# Patient Record
Sex: Female | Born: 1990 | Race: White | Hispanic: No | Marital: Single | Smoking: Current every day smoker
Health system: Southern US, Community
[De-identification: ages and names within clinical notes are randomized; demographics above are authoritative.]

## PROBLEM LIST (undated history)

## (undated) DIAGNOSIS — F1110 Opioid abuse, uncomplicated: Secondary | ICD-10-CM

## (undated) DIAGNOSIS — F1010 Alcohol abuse, uncomplicated: Secondary | ICD-10-CM

## (undated) DIAGNOSIS — B1920 Unspecified viral hepatitis C without hepatic coma: Secondary | ICD-10-CM

## (undated) DIAGNOSIS — K859 Acute pancreatitis without necrosis or infection, unspecified: Secondary | ICD-10-CM

## (undated) HISTORY — PX: TONSILLECTOMY: SUR1361

## (undated) HISTORY — PX: HX TONSILLECTOMY: SHX27

---

## 2016-07-23 ENCOUNTER — Encounter (HOSPITAL_BASED_OUTPATIENT_CLINIC_OR_DEPARTMENT_OTHER): Payer: Self-pay

## 2016-07-23 ENCOUNTER — Emergency Department (HOSPITAL_BASED_OUTPATIENT_CLINIC_OR_DEPARTMENT_OTHER): Payer: Self-pay

## 2016-07-23 ENCOUNTER — Inpatient Hospital Stay (HOSPITAL_BASED_OUTPATIENT_CLINIC_OR_DEPARTMENT_OTHER)
Admission: EM | Admit: 2016-07-23 | Discharge: 2016-07-27 | DRG: 439 | Discharge status: Against Medical Advice | Payer: Self-pay | Attending: Internal Medicine | Admitting: Internal Medicine

## 2016-07-23 DIAGNOSIS — E876 Hypokalemia: Secondary | ICD-10-CM | POA: Diagnosis present

## 2016-07-23 DIAGNOSIS — K860 Alcohol-induced chronic pancreatitis: Secondary | ICD-10-CM

## 2016-07-23 DIAGNOSIS — R112 Nausea with vomiting, unspecified: Secondary | ICD-10-CM

## 2016-07-23 DIAGNOSIS — F10239 Alcohol dependence with withdrawal, unspecified: Secondary | ICD-10-CM | POA: Diagnosis present

## 2016-07-23 DIAGNOSIS — K852 Alcohol induced acute pancreatitis without necrosis or infection: Secondary | ICD-10-CM

## 2016-07-23 DIAGNOSIS — K859 Acute pancreatitis without necrosis or infection, unspecified: Secondary | ICD-10-CM

## 2016-07-23 DIAGNOSIS — F17210 Nicotine dependence, cigarettes, uncomplicated: Secondary | ICD-10-CM | POA: Diagnosis present

## 2016-07-23 DIAGNOSIS — E860 Dehydration: Secondary | ICD-10-CM | POA: Diagnosis present

## 2016-07-23 DIAGNOSIS — B1920 Unspecified viral hepatitis C without hepatic coma: Secondary | ICD-10-CM

## 2016-07-23 DIAGNOSIS — N3000 Acute cystitis without hematuria: Secondary | ICD-10-CM

## 2016-07-23 DIAGNOSIS — K700 Alcoholic fatty liver: Secondary | ICD-10-CM

## 2016-07-23 DIAGNOSIS — Z720 Tobacco use: Secondary | ICD-10-CM

## 2016-07-23 DIAGNOSIS — D696 Thrombocytopenia, unspecified: Secondary | ICD-10-CM | POA: Insufficient documentation

## 2016-07-23 DIAGNOSIS — F1020 Alcohol dependence, uncomplicated: Secondary | ICD-10-CM

## 2016-07-23 DIAGNOSIS — N179 Acute kidney failure, unspecified: Secondary | ICD-10-CM | POA: Diagnosis present

## 2016-07-23 DIAGNOSIS — K7010 Alcoholic hepatitis without ascites: Secondary | ICD-10-CM | POA: Diagnosis present

## 2016-07-23 DIAGNOSIS — F1190 Opioid use, unspecified, uncomplicated: Secondary | ICD-10-CM | POA: Diagnosis present

## 2016-07-23 HISTORY — DX: Opioid abuse, uncomplicated (CMS HCC): F11.10

## 2016-07-23 HISTORY — DX: Alcohol abuse, uncomplicated: F10.10

## 2016-07-23 HISTORY — DX: Unspecified viral hepatitis C without hepatic coma: B19.20

## 2016-07-23 LAB — CBC WITH DIFF
BASOPHIL #: 0 x10ˆ3/uL (ref 0.00–0.10)
BASOPHIL %: 0 % (ref 0–3)
EOSINOPHIL #: 0 x10ˆ3/uL (ref 0.00–0.50)
EOSINOPHIL %: 0 % (ref 0–5)
HCT: 49.1 % — ABNORMAL HIGH (ref 36.0–45.0)
HGB: 16.6 g/dL — ABNORMAL HIGH (ref 12.0–15.5)
LYMPHOCYTE #: 0.5 10*3/uL — ABNORMAL LOW (ref 1.00–4.80)
LYMPHOCYTE #: 0.5 x10ˆ3/uL — ABNORMAL LOW (ref 1.00–4.80)
LYMPHOCYTE %: 6 % — ABNORMAL LOW (ref 15–43)
MCH: 31.7 pg (ref 27.5–33.2)
MCHC: 33.8 g/dL (ref 32.0–36.0)
MCV: 93.7 fL (ref 82.0–97.0)
MONOCYTE #: 0.4 x10ˆ3/uL (ref 0.20–0.90)
MONOCYTE %: 5 % (ref 5–12)
MPV: 9.9 fL (ref 7.4–10.5)
NEUTROPHIL #: 7.3 x10ˆ3/uL — ABNORMAL HIGH (ref 1.50–6.50)
NEUTROPHIL %: 89 % — ABNORMAL HIGH (ref 43–76)
PLATELETS: 103 x10ˆ3/uL — ABNORMAL LOW (ref 150–450)
RBC: 5.24 x10ˆ6/uL — ABNORMAL HIGH (ref 4.00–5.10)
RDW: 13.6 % (ref 11.0–16.0)
WBC: 8.2 x10ˆ3/uL (ref 4.0–11.0)

## 2016-07-23 LAB — COMPREHENSIVE METABOLIC PROFILE - BMC/JMC ONLY
ALBUMIN/GLOBULIN RATIO: 1.4 (ref 0.8–2.0)
ALBUMIN: 4.2 g/dL (ref 3.5–5.0)
ALKALINE PHOSPHATASE: 156 U/L — ABNORMAL HIGH (ref 38–126)
ALT (SGPT): 59 U/L — ABNORMAL HIGH (ref 14–54)
ANION GAP: 9 mmol/L (ref 3–11)
AST (SGOT): 201 U/L — ABNORMAL HIGH (ref 15–41)
BILIRUBIN TOTAL: 4 mg/dL — ABNORMAL HIGH (ref 0.3–1.2)
BUN/CREA RATIO: 11 (ref 6–22)
BUN/CREA RATIO: 11 (ref 6–22)
BUN: 11 mg/dL (ref 6–20)
CALCIUM: 9.6 mg/dL (ref 8.6–10.3)
CALCIUM: 9.6 mg/dL (ref 8.6–10.3)
CHLORIDE: 96 mmol/L — ABNORMAL LOW (ref 101–111)
CO2 TOTAL: 29 mmol/L (ref 22–32)
CREATININE: 1.01 mg/dL — ABNORMAL HIGH (ref 0.44–1.00)
CREATININE: 1.01 mg/dL — ABNORMAL HIGH (ref 0.44–1.00)
ESTIMATED GFR: >60 mL/min/1.73mˆ2 (ref >60)
GLUCOSE: 136 mg/dL — ABNORMAL HIGH (ref 70–110)
POTASSIUM: 3.8 mmol/L (ref 3.4–5.1)
PROTEIN TOTAL: 7.2 g/dL (ref 6.4–8.3)
SODIUM: 134 mmol/L — ABNORMAL LOW (ref 136–145)

## 2016-07-23 LAB — URINALYSIS WITH MICROSCOPIC REFLEX IF INDICATED BMC/JMC ONLY
GLUCOSE: NEGATIVE mg/dL
LEUKOCYTES: NEGATIVE WBCs/uL
NITRITE: POSITIVE — AB
PROTEIN: 100 mg/dL — AB
SPECIFIC GRAVITY: >=1.03 — ABNORMAL HIGH (ref <1.022)
UROBILINOGEN: 1 mg/dL (ref <=2.0)

## 2016-07-23 LAB — URINALYSIS, MICROSCOPIC

## 2016-07-23 LAB — BLUE TOP TUBE

## 2016-07-23 LAB — LIPID PANEL
CHOL/HDL RATIO: 3.1
CHOLESTEROL: 181 mg/dL (ref 120–199)
CHOLESTEROL: 181 mg/dL (ref 120–199)
HDL CHOL: 59 mg/dL (ref 45–65)
LDL CALC: 111 mg/dL (ref <=130)
TRIGLYCERIDES: 53 mg/dL (ref 35–135)
VLDL CALC: 11 mg/dL (ref 5–35)

## 2016-07-23 LAB — PT/INR
INR: 1.18
PROTHROMBIN TIME: 13 seconds — ABNORMAL HIGH (ref 9.4–12.5)

## 2016-07-23 LAB — PTT (PARTIAL THROMBOPLASTIN TIME): APTT: 26.3 seconds (ref 25.1–36.5)

## 2016-07-23 LAB — HCG, URINE QUALITATIVE, PREGNANCY: HCG URINE QUALITATIVE: NEGATIVE

## 2016-07-23 LAB — GREEN TUBE

## 2016-07-23 LAB — RED TOP TUBE

## 2016-07-23 MED ORDER — METOCLOPRAMIDE 5 MG/ML INJECTION SOLUTION
10.00 mg | INTRAMUSCULAR | Status: AC
Start: 2016-07-23 — End: 2016-07-23
  Administered 2016-07-23: 10 mg via INTRAVENOUS
  Filled 2016-07-23: qty 2

## 2016-07-23 MED ORDER — ACETAMINOPHEN 325 MG TABLET
650.0000 mg | ORAL_TABLET | Freq: Four times a day (QID) | ORAL | Status: DC | PRN
Start: 2016-07-23 — End: 2016-07-24

## 2016-07-23 MED ORDER — SODIUM CHLORIDE 0.9 % (FLUSH) INJECTION SYRINGE
10.0000 mL | INJECTION | Freq: Three times a day (TID) | INTRAMUSCULAR | Status: DC
Start: 2016-07-23 — End: 2016-07-23

## 2016-07-23 MED ORDER — LACTATED RINGERS INTRAVENOUS SOLUTION
INTRAVENOUS | Status: DC
Start: 2016-07-24 — End: 2016-07-27
  Administered 2016-07-24 – 2016-07-27 (×4): 0 via INTRAVENOUS

## 2016-07-23 MED ORDER — HYDROMORPHONE 2 MG/ML INJECTION SYRINGE
1.00 mg | INJECTION | INTRAMUSCULAR | Status: DC | PRN
Start: 2016-07-23 — End: 2016-07-25
  Administered 2016-07-24 – 2016-07-25 (×8): 1 mg via INTRAVENOUS
  Filled 2016-07-23 (×8): qty 1

## 2016-07-23 MED ORDER — THIAMINE HCL (VITAMIN B1) 100 MG/ML INJECTION SOLUTION
INTRAVENOUS | Status: AC
Start: 2016-07-24 — End: 2016-07-26
  Filled 2016-07-23 (×3): qty 1000

## 2016-07-23 MED ORDER — SODIUM CHLORIDE 0.9 % (FLUSH) INJECTION SYRINGE
10.0000 mL | INJECTION | Freq: Three times a day (TID) | INTRAMUSCULAR | Status: DC
Start: 2016-07-24 — End: 2016-07-27
  Administered 2016-07-24: 0 mL via INTRAVENOUS
  Administered 2016-07-24 – 2016-07-25 (×4): 10 mL via INTRAVENOUS
  Administered 2016-07-25: 0 mL via INTRAVENOUS
  Administered 2016-07-26: 10 mL via INTRAVENOUS
  Administered 2016-07-26: 0 mL via INTRAVENOUS
  Administered 2016-07-26: 10 mL via INTRAVENOUS
  Administered 2016-07-27 (×2): 0 mL via INTRAVENOUS

## 2016-07-23 MED ORDER — LORAZEPAM 2 MG/ML INJECTION SOLUTION
2.0000 mg | INTRAMUSCULAR | Status: DC | PRN
Start: 2016-07-23 — End: 2016-07-27
  Administered 2016-07-24 – 2016-07-27 (×6): 2 mg via INTRAVENOUS
  Filled 2016-07-23 (×6): qty 1

## 2016-07-23 MED ORDER — PIPERACILLIN-TAZOBACTAM 3.375 GRAM/50 ML DEXTROSE(ISO-OS) IV PIGGYBACK
3.3750 g | INJECTION | INTRAVENOUS | Status: AC
Start: 2016-07-23 — End: 2016-07-23
  Administered 2016-07-23: 0 g via INTRAVENOUS
  Administered 2016-07-23: 3.375 g via INTRAVENOUS
  Filled 2016-07-23: qty 50

## 2016-07-23 MED ORDER — SODIUM CHLORIDE 0.9 % (FLUSH) INJECTION SYRINGE
10.0000 mL | INJECTION | INTRAMUSCULAR | Status: DC | PRN
Start: 2016-07-23 — End: 2016-07-27
  Administered 2016-07-24: 10 mL via INTRAVENOUS

## 2016-07-23 MED ORDER — MORPHINE 4 MG/ML INJECTION WRAPPER
4.0000 mg | INJECTION | INTRAMUSCULAR | Status: DC | PRN
Start: 2016-07-23 — End: 2016-07-26

## 2016-07-23 MED ORDER — LORAZEPAM 2 MG/ML INJECTION SOLUTION
2.0000 mg | Freq: Four times a day (QID) | INTRAMUSCULAR | Status: DC | PRN
Start: 2016-07-23 — End: 2016-07-27
  Administered 2016-07-24 – 2016-07-27 (×9): 2 mg via INTRAVENOUS
  Filled 2016-07-23 (×9): qty 1

## 2016-07-23 MED ORDER — LORAZEPAM 2 MG/ML INJECTION SOLUTION
2.00 mg | INTRAMUSCULAR | Status: AC
Start: 2016-07-23 — End: 2016-07-23
  Administered 2016-07-23: 2 mg via INTRAVENOUS
  Filled 2016-07-23: qty 1

## 2016-07-23 MED ORDER — NICOTINE 14 MG/24 HR DAILY TRANSDERMAL PATCH
14.0000 mg | MEDICATED_PATCH | Freq: Every day | TRANSDERMAL | Status: DC
Start: 2016-07-24 — End: 2016-07-27
  Administered 2016-07-24 – 2016-07-27 (×7): 14 mg via TRANSDERMAL
  Filled 2016-07-23 (×4): qty 1

## 2016-07-23 MED ORDER — ONDANSETRON HCL (PF) 4 MG/2 ML INJECTION SOLUTION
4.0000 mg | Freq: Three times a day (TID) | INTRAMUSCULAR | Status: DC | PRN
Start: 2016-07-23 — End: 2016-07-27
  Administered 2016-07-24 – 2016-07-27 (×6): 4 mg via INTRAVENOUS
  Filled 2016-07-23 (×6): qty 2

## 2016-07-23 MED ORDER — NITROGLYCERIN 0.4 MG SUBLINGUAL TABLET
0.4000 mg | SUBLINGUAL_TABLET | SUBLINGUAL | Status: DC | PRN
Start: 2016-07-23 — End: 2016-07-27

## 2016-07-23 MED ORDER — SODIUM CHLORIDE 0.9 % IV BOLUS
1000.00 mL | INJECTION | Status: AC
Start: 2016-07-23 — End: 2016-07-23
  Administered 2016-07-23: 1000 mL via INTRAVENOUS
  Administered 2016-07-23: 0 mL via INTRAVENOUS

## 2016-07-23 NOTE — ED Provider Notes (Signed)
Caprice Red, MD  [facility]  Emergency Department Visit Note    Date:  07/23/2016  Primary care provider:  Pcp Not In System  Means of arrival:  private car  History obtained from: patient  History limited by: none    Chief Complaint:  Abdominal pain     HISTORY OF PRESENT ILLNESS     [name], date of birth 1991/03/19, is a 25 y.o. female who presents to the Emergency Department complaining of abdominal pain. Patient has been sick since June and has not gone to the doctor because she has does not have insurance. Patient states she has been vomiting multiple times everyday for the past few months. Patient cannot eat, and she can only drink liquids. Patient states the pain radiates into her mid back. She is having left upper abdominal pain/epigastric pain. She denies constant chest pain, and she states the only time it hurts is when she is vomiting. Patient states she always feels like she has something stuck in her throat. Patient keeps getting random small bruises recently, and she wakes up with them. Her fiance states she does not roll around in her sleep. The patients abdominal pain is always there but fluctuates in severity and was worse initially with eating but then she stopped eating 2 weeks ago, and now it is even worse when she drinks (has been drinking Ensure along with other liquids). Patient denies blood in her stool, blood in her vomit, dysuria, hematuria, fever, chills, weakness, and dizziness. Patient states she use to weigh 121 lbs 3 months ago, but is now 102 lbs. Patient has had irregular periods and is not sure when she last had any vaginal bleeding. Patient has not done any illicit drugs in two years (has been on Suboxone). Patient has been drinking 2-3 bottles of wine a day and this also seems to increase her pain. She affirms diarrhea once or twice a day. Patient was recently diagnosed a month ago with hepatitis C. Patient rates the constant pain a 9/10 at this time.          REVIEW OF SYSTEMS     The pertinent positive and negative symptoms are as per HPI. All other systems reviewed and are negative.     PATIENT HISTORY     Past Medical History:  Past Medical History:   Diagnosis Date    ETOH abuse     Hepatitis C     Heroin abuse        Past Surgical History:  Past Surgical History:   Procedure Laterality Date    Hx tonsillectomy         Family History:  Family Medical History     None        Social History:  Social History   Substance Use Topics    Smoking status: Current Every Day Smoker     Packs/day: 1.00     Types: Cigarettes    Smokeless tobacco: None    Alcohol use Yes      Comment: daily 2 large bottles of wine     History   Drug Use No     Comment: hx heroin       Medications:  Outpatient Prescriptions Marked as Taking for the 07/23/16 encounter [facility] Encounter)   Medication Sig    buprenorphine-naloxone (SUBOXONE) 8-2 mg Sublingual Tablet, Sublingual 2 Tabs by Sublingual route Twice daily       Allergies:  No Known Allergies    PHYSICAL  EXAM     Vitals:   07/23/16 1612   BP: (!) 161/94   Pulse: (!) 102   Resp: (!) 24   Temp: 36.7 C (98 F)   SpO2: 97%       Pulse ox  97% on None (Room Air) interpreted by me as: Normal    Constitutional: thin young female, no acute distress  Head: Normocephalic and atraumatic.   ENT: Moist mucous membranes. No erythema or exudates in the oropharynx.  Eyes: EOM are normal. Pupils are equal, round, and reactive to light. No scleral icterus.   Neck: Neck supple. No meningismus.  Cardiovascular: Normal rate and regular rhythm. Normal S1 and S2. Exam reveals no gallop and no friction rub.  No murmur heard.  Pulmonary/Chest: Effort normal and breath sounds normal.   Abdominal: Soft. No distension. No organomegaly. Normal bowel sounds present. Epigastric and left upper quadrant tenderness with some voluntary guarding.   Back: There is no CVA tenderness.  Musculoskeletal: Normal range of motion. No edema and no extremity tenderness. No  clubbing or cyanosis.  Lymphadenopathy: No cervical adenopathy.   Neurological: Patient is alert and oriented to person, place, and time. Strength and sensation normal in all extremities. Cranial nerves II - XII intact. Normal speech.  Skin: Skin is warm and dry. No rash noted.     DIAGNOSTIC STUDIES     Labs:    Results for orders placed or performed during the hospital encounter of 07/23/16   COMPREHENSIVE METABOLIC PROFILE - [facility] ONLY   Result Value Ref Range    SODIUM 134 (L) 136 - 145 mmol/L    POTASSIUM 3.8 3.4 - 5.1 mmol/L    CHLORIDE 96 (L) 101 - 111 mmol/L    CO2 TOTAL 29 22 - 32 mmol/L    ANION GAP 9 3 - 11 mmol/L    BUN 11 6 - 20 mg/dL    CREATININE 1.61 (H) 0.44 - 1.00 mg/dL    BUN/CREA RATIO 11 6 - 22    ESTIMATED GFR >60 >60 mL/min/1.51m2    ALBUMIN 4.2 3.5 - 5.0 g/dL    CALCIUM 9.6 8.6 - 09.6 mg/dL    GLUCOSE 045 (H) 70 - 110 mg/dL    ALKALINE PHOSPHATASE 156 (H) 38 - 126 U/L    ALT (SGPT) 59 (H) 14 - 54 U/L    AST (SGOT) 201 (H) 15 - 41 U/L    BILIRUBIN TOTAL 4.0 (H) 0.3 - 1.2 mg/dL    PROTEIN TOTAL 7.2 6.4 - 8.3 g/dL    ALBUMIN/GLOBULIN RATIO 1.4 0.8 - 2.0   LIPASE   Result Value Ref Range    LIPASE 917 (H) 22 - 51 U/L   HCG, URINE QUALITATIVE, PREGNANCY   Result Value Ref Range    HCG URINE QUALITATIVE Negative    URINALYSIS WITH MICROSCOPIC REFLEX IF INDICATED [facility] ONLY   Result Value Ref Range    COLOR Orange (A) Light Yellow, Straw, Yellow    APPEARANCE Clear Clear    PH 6.0 <8.0    LEUKOCYTES Negative Negative WBCs/uL    NITRITE Positive (A) Negative    PROTEIN 100  (A) Negative mg/dL    GLUCOSE Negative Negative mg/dL    KETONES Trace (A) Negative mg/dL    UROBILINOGEN 1.0 <=4.0 mg/dL    BILIRUBIN Moderate (A) Negative mg/dL    BLOOD Negative Negative mg/dL    SPECIFIC GRAVITY >=9.811 (H) <1.022   CBC WITH DIFF   Result Value Ref Range  WBC 8.2 4.0 - 11.0 x103/uL    RBC 5.24 (H) 4.00 - 5.10 x106/uL    HGB 16.6 (H) 12.0 - 15.5 g/dL    HCT 16.149.1 (H) 09.636.0 - 45.0 %    MCV 93.7 82.0 -  97.0 fL    MCH 31.7 27.5 - 33.2 pg    MCHC 33.8 32.0 - 36.0 g/dL    RDW 04.513.6 40.911.0 - 81.116.0 %    PLATELETS 103 (L) 150 - 450 x103/uL    MPV 9.9 7.4 - 10.5 fL    NEUTROPHIL % 89 (H) 43 - 76 %    LYMPHOCYTE % 6 (L) 15 - 43 %    MONOCYTE % 5 5 - 12 %    EOSINOPHIL % 0 0 - 5 %    BASOPHIL % 0 0 - 3 %    NEUTROPHIL # 7.30 (H) 1.50 - 6.50 x103/uL    LYMPHOCYTE # 0.50 (L) 1.00 - 4.80 x103/uL    MONOCYTE # 0.40 0.20 - 0.90 x103/uL    EOSINOPHIL # 0.00 0.00 - 0.50 x103/uL    BASOPHIL # 0.00 0.00 - 0.10 x103/uL   BLUE TOP TUBE   Result Value Ref Range    RAINBOW/EXTRA TUBE AUTO RESULT Yes    RED TOP TUBE   Result Value Ref Range    RAINBOW/EXTRA TUBE AUTO RESULT Yes    Green Tube   Result Value Ref Range    RAINBOW/EXTRA TUBE AUTO RESULT Yes    URINALYSIS, MICROSCOPIC   Result Value Ref Range    RBCS 0-2 0 - 2 /hpf    WBCS 0-2 0 - 2 /hpf    BACTERIA Marked (A) None /hpf    SQUAMOUS EPITHELIAL 5-10 (A) 0 - 2 /hpf    TRANSITIONAL EPITHELIAL 0-2 0 - 2 /hpf    MUCOUS Marked (A) None /hpf    HYALINE CASTS 0-2 0 - 2 /lpf   PT/INR   Result Value Ref Range    PROTHROMBIN TIME 13.0 (H) 9.4 - 12.5 seconds    INR 1.18    PTT (PARTIAL THROMBOPLASTIN TIME)   Result Value Ref Range    APTT 26.3 25.1 - 36.5 seconds     Labs reviewed and interpreted by me.    Radiology:    US ABDOMEN RIGHT UPPER QUADRANT 1. Diffuse fatty liver 2. Borderline common bile duct at 7 mm. MRCP is recommended rule out distal common bile duct stone 3. No gallstones.    MRCP: Moderate to severe pancreatitis.  No CBD stone.  No gallstones.      Radiological imaging interpreted by radiologist and independently reviewed by me.    ED PROGRESS NOTE / MEDICAL DECISION MAKING     Old records reviewed by me:  I have reviewed the nurse's notes. I have reviewed the patient's problem list.      Orders Placed This Encounter    URINE CULTURE    US ABDOMEN RIGHT UPPER QUADRANT    MRI MRCP WO IV CONTRAST    CBC/DIFF    COMPREHENSIVE METABOLIC PROFILE - [facility] ONLY     LIPASE    HCG, URINE QUALITATIVE, PREGNANCY    URINALYSIS WITH MICROSCOPIC REFLEX IF INDICATED [facility] ONLY    URINALYSIS, MICROSCOPIC    PT/INR    PTT (PARTIAL THROMBOPLASTIN TIME)    ADDITIONAL TESTING  REQUEST    INSERT & MAINTAIN PERIPHERAL IV ACCESS    NS flush syringe    NS bolus infusion 1,000 mL  piperacillin-tazobactam (ZOSYN) 3.375 g in iso-osmotic 50 mL premix IVPB    metoclopramide (REGLAN) 5 mg/mL injection    LORazepam (ATIVAN) 2 mg/mL injection       US abdomen RUQ, CBC, Lipase, HCG, PT/INR, and PTT ordered.    6:48 PM: Initial evaluation is complete at this time. I discussed with the patient that I would order labs and additional imaging (MRI per radiologist's recommendations) to further evaluate. Patient is agreeable with the treatment plan at this time. I have ordered IV antibiotics to treat the UTI, reglan to treat her nausea, and ativan to help with mild alcohol withdrawal.      9:59 PM: I discussed the patient's case and above findings at length with Dr. Jonette Pesa [facility]) who agrees to admit.    10:06 PM: On recheck,  I explained the results of the diagnostic studies- moderate to severe pancreatitis, but no evidence of a CBD stone. I informed the patient of my discussion with Dr. Jonette Pesa (Hospitalist). I discussed the diagnosis and the plan for admission. The patient understood and is in accordance with the treatment plan at this time. All of her questions have been answered to her satisfaction. The patient is in fair condition at the time of admission. I strongly advised the patient to stop drinking alcohol (not drink ever again).     I have screened the patient for tobacco use and the patient is a tobacco user.  I have counseled the patient for less than 3 minutes to quit using tobacco products due to their multiple adverse health effects.     Pre-hypertension/Hypertension: The patient has been informed that they may have pre-hypertension or hypertension based on an elevated  blood pressure reading in the ED.  The admitting physician will follow up on this while the patient is in the hospital    Pre-Disposition Vitals:  Filed Vitals:    07/23/16 1730 07/23/16 1800 07/23/16 2026 07/23/16 2213   BP: (!) 127/99 (!) 128/98 118/88 122/87   Pulse: 78  74 76   Resp: 17  16 16    Temp:       SpO2: 99% 99% 99% 99%     CLINICAL IMPRESSION     Encounter Diagnoses   Name Primary?    Acute pancreatitis Yes    Intractable vomiting with nausea, unspecified vomiting type     Acute cystitis without hematuria      DISPOSITION/PLAN     Admitted        Condition at Disposition: Fair        SCRIBE ATTESTATION STATEMENT  I Justyce Knotts, SCRIBE scribed for Caprice Red, MD on 07/23/2016 at 6:48 PM.     Documentation assistance provided for Lynsi Dooner, Cristal Deer, MD  by Idalia Needle, SCRIBE. Information recorded by the scribe was done at my direction and has been reviewed and validated by me Taryn Shellhammer, Cristal Deer, MD.

## 2016-07-23 NOTE — ED Nurses Note (Signed)
Upon entering the room I introduced myself and explained my role to the pt. I verified with the pts name and date of birth. I educated the pt on testing and updated them on their status and next step to be taken. Pt is resting comfortably in stretcher in no signs of distress with call bell in reach. Upon leaving the room I asked if there was anything else I could do for them.

## 2016-07-23 NOTE — ED Nurses Note (Signed)
Called and spoke to TabernashRyan in MRI and he stated that it would be at least another 20 minutes before he could take the patient.

## 2016-07-23 NOTE — ED Nurses Note (Signed)
Report called to the 5th floor.

## 2016-07-23 NOTE — ED Nurses Note (Signed)
Pt returned from radiology testing and placed back on monitor. Side rails up and call bell in reach. Pt in no signs of distress. Educated pt/family on how long it would take for results of testing.

## 2016-07-23 NOTE — ED Nurses Note (Signed)
Blood drawn with IV placement. Tubes labeled at the bedside, pt verified labels. Blood tubed to lab.

## 2016-07-23 NOTE — H&P (Addendum)
[facility]  [place], [place] [address]    General History and Physical  Candelaria Celeste DNP, APRN     Shane Crutch  Date of Admission:  07/23/2016  Date of Birth:  Jun 19, 1991    PCP: Pcp Not In System  Chief Complaint:  Abdominal pain and vomiting      HPI: [name] is a 25 y.o., White female who presents with abdominal pain and intractable nausea and vomiting.  The patient reports that she has had vomiting nearly every day since June but has not went to a doctor for workup due to not having insurance.  She reports that now she is not able to eat at all and can only keep down some liquids.  The pain is predominantly in the epigastric and left upper quadrant pain however she does report it does radiate around to the left flank and left back.  She reports that the nausea and vomiting as well as the abdominal pain has significantly increased over the past 2 days.  She denies any chest pain but does have epigastric pain with vomiting.  She affirms that she has been bruising easily.  She denies any hematemesis, melena, or hematochezia, fevers, chills, urinary symptoms, or dizziness.  The patient reports that she has had approximately 18-20 lb weight loss over the past several months.  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  She does takes Subutex but has been unable to keep it down in the past 1-2 days.  The patient also reports that she has been a heavy drinker drinking 2-3 large bottles of wine per day.  She reports that she has been trying to cut back over the past couple weeks, but now is unable to stop vomiting or take the abdominal pain. She does have loose stools 1-2 times per day and she affirms that she has was diagnosed with hepatitis C approximately 1 month ago.  The patient is being admitted for acute pancreatitis with intractable nausea and vomiting.  We have discussed a plan for pain management and she has agreed to a  short period of time of IV opiates as she is unable to keep down her Subutex.  She agrees that when she is able to adequately keep down p.o. fluids she will go back to the Subutex.  She is also going to be medicated for her alcohol withdrawal.  The patient has an ultimate goal of alcohol cessation.    Active Hospital Problems   (*Primary Problem)    Diagnosis    Acute pancreatitis       Past Medical History:   Diagnosis Date    ETOH abuse     Hepatitis C     Heroin abuse        Past Surgical History:   Procedure Laterality Date    HX TONSILLECTOMY         Medications Prior to Admission     Prescriptions    buprenorphine-naloxone (SUBOXONE) 8-2 mg Sublingual Tablet, Sublingual    2 Tabs by Sublingual route Twice daily          Current Facility-Administered Medications:  NS flush syringe 10 mL Intravenous Q8H       No Known Allergies    Social History   Substance Use Topics    Smoking status: Current Every Day Smoker     Packs/day: 1.00     Types: Cigarettes  Smokeless tobacco: Not on file    Alcohol use Yes      Comment: daily 2 large bottles of wine       Family Medical History     None          ROS: Other than ROS in the HPI, all other systems were negative.    DNR Status:  No Order    EXAM:  Temperature: 36.7 C (98 F)  Heart Rate: 76  BP (Non-Invasive): 122/87  Respiratory Rate: 16  SpO2-1: 99 %  Pain Score (Numeric, Faces): 9      Constitutional: appears chronically ill, acutely ill, mild distress and vital signs reviewed and discussed with patient  Eyes: Conjunctiva clear., Pupils equal and round. , Sclera non-icteric.   ENT: Nose without erythema. , Mouth mucous membranes dry. , Pharynx without injection or exudate. , No oral lesions.   Neck: no thyromegaly or lymphadenopathy and supple, symmetrical, trachea midline  Respiratory: Clear to auscultation bilaterally. , Respirations are equal nonlabored  Cardiovascular: regular rate and rhythm, S1, S2 normal, no murmur, click, rub or gallop  No JVD      No pedal edema.  DP pulses are palpable and equal bilaterally.  Gastrointestinal: non-distended, Bowel sounds: Hypoactive, Palpation: Soft and Tenderness: LUQ, epigastric, LLQ, No masses, No hernias, No CVA Tenderness  Genitourinary: Deferred  Musculoskeletal: Head atraumatic and normocephalic, AROM and PROM  Integumentary:  Skin warm and dry, No rashes, No lesions and No track marks apparent  Neurologic: CN II - XII grossly intact , Alert and oriented X 3, normal strength and tone. Normal symmetric reflexes. Normal coordination and gait, No tremor  Lymphatic/Immunologic/Hematologic: No lymphadenopathy, Cervical, supraclavicular, and axillary nodes normal.  Psychiatric: Normal, No suicidal or homicidal ideations, Pleasant and sincere    Labs:    I have reviewed all lab results.  Lab Results for Last 24 Hours:    Results for orders placed or performed during the hospital encounter of 07/23/16 (from the past 24 hour(s))   HCG, URINE QUALITATIVE, PREGNANCY   Result Value Ref Range    HCG URINE QUALITATIVE Negative    URINALYSIS WITH MICROSCOPIC REFLEX IF INDICATED [facility] ONLY   Result Value Ref Range    COLOR Orange (A) Light Yellow, Straw, Yellow    APPEARANCE Clear Clear    PH 6.0 <8.0    LEUKOCYTES Negative Negative WBCs/uL    NITRITE Positive (A) Negative    PROTEIN 100  (A) Negative mg/dL    GLUCOSE Negative Negative mg/dL    KETONES Trace (A) Negative mg/dL    UROBILINOGEN 1.0 <=1.6<=2.0 mg/dL    BILIRUBIN Moderate (A) Negative mg/dL    BLOOD Negative Negative mg/dL    SPECIFIC GRAVITY >=1.096>=1.030 (H) <1.022   URINALYSIS, MICROSCOPIC   Result Value Ref Range    RBCS 0-2 0 - 2 /hpf    WBCS 0-2 0 - 2 /hpf    BACTERIA Marked (A) None /hpf    SQUAMOUS EPITHELIAL 5-10 (A) 0 - 2 /hpf    TRANSITIONAL EPITHELIAL 0-2 0 - 2 /hpf    MUCOUS Marked (A) None /hpf    HYALINE CASTS 0-2 0 - 2 /lpf   COMPREHENSIVE METABOLIC PROFILE - [facility] ONLY   Result Value Ref Range    SODIUM 134 (L) 136 - 145 mmol/L    POTASSIUM 3.8 3.4 - 5.1 mmol/L     CHLORIDE 96 (L) 101 - 111 mmol/L    CO2 TOTAL 29 22 - 32 mmol/L  ANION GAP 9 3 - 11 mmol/L    BUN 11 6 - 20 mg/dL    CREATININE 1.61 (H) 0.44 - 1.00 mg/dL    BUN/CREA RATIO 11 6 - 22    ESTIMATED GFR >60 >60 mL/min/1.52m2    ALBUMIN 4.2 3.5 - 5.0 g/dL    CALCIUM 9.6 8.6 - 09.6 mg/dL    GLUCOSE 045 (H) 70 - 110 mg/dL    ALKALINE PHOSPHATASE 156 (H) 38 - 126 U/L    ALT (SGPT) 59 (H) 14 - 54 U/L    AST (SGOT) 201 (H) 15 - 41 U/L    BILIRUBIN TOTAL 4.0 (H) 0.3 - 1.2 mg/dL    PROTEIN TOTAL 7.2 6.4 - 8.3 g/dL    ALBUMIN/GLOBULIN RATIO 1.4 0.8 - 2.0   LIPASE   Result Value Ref Range    LIPASE 917 (H) 22 - 51 U/L   CBC WITH DIFF   Result Value Ref Range    WBC 8.2 4.0 - 11.0 x103/uL    RBC 5.24 (H) 4.00 - 5.10 x106/uL    HGB 16.6 (H) 12.0 - 15.5 g/dL    HCT 40.9 (H) 81.1 - 45.0 %    MCV 93.7 82.0 - 97.0 fL    MCH 31.7 27.5 - 33.2 pg    MCHC 33.8 32.0 - 36.0 g/dL    RDW 91.4 78.2 - 95.6 %    PLATELETS 103 (L) 150 - 450 x103/uL    MPV 9.9 7.4 - 10.5 fL    NEUTROPHIL % 89 (H) 43 - 76 %    LYMPHOCYTE % 6 (L) 15 - 43 %    MONOCYTE % 5 5 - 12 %    EOSINOPHIL % 0 0 - 5 %    BASOPHIL % 0 0 - 3 %    NEUTROPHIL # 7.30 (H) 1.50 - 6.50 x103/uL    LYMPHOCYTE # 0.50 (L) 1.00 - 4.80 x103/uL    MONOCYTE # 0.40 0.20 - 0.90 x103/uL    EOSINOPHIL # 0.00 0.00 - 0.50 x103/uL    BASOPHIL # 0.00 0.00 - 0.10 x103/uL   BLUE TOP TUBE   Result Value Ref Range    RAINBOW/EXTRA TUBE AUTO RESULT Yes    RED TOP TUBE   Result Value Ref Range    RAINBOW/EXTRA TUBE AUTO RESULT Yes    Green Tube   Result Value Ref Range    RAINBOW/EXTRA TUBE AUTO RESULT Yes    PT/INR   Result Value Ref Range    PROTHROMBIN TIME 13.0 (H) 9.4 - 12.5 seconds    INR 1.18    PTT (PARTIAL THROMBOPLASTIN TIME)   Result Value Ref Range    APTT 26.3 25.1 - 36.5 seconds       Imaging Studies:  See Below    EXAMINATION: MRI MRCP WO IV CONTRAST     EXAM DATE/TIME:  07/23/2016 9:22 PM    CLINICAL INDICATION: pancreatitis, dilated CBD on Ultrasound    COMPARISON:  None.    FINDINGS:    Gall Bladder: Normal appearance of gall bladder without distension or  stones.    Common Bile Duct: 7 mm. No common bile duct stones.    Pancreatic duct: Normal. Not dilated.    Liver: Normal size. No focal masses.    Spleen: Normal size and appearance.    Pancreas: Diffuse pancreatic edema and peripancreatic edema in the  retroperitoneum    Other soft tissues: No abnormality seen.      IMPRESSION:  1. Changes consistent with moderate to severe acute pancreatitis.  2. No evidence of gallstones or common bile duct stones.            EXAMINATION: US ABDOMEN RIGHT UPPER QUADRANT     EXAM DATE/TIME:  07/23/2016 6:46 PM    CLINICAL INDICATION: epigastric pain, elevated lipase and LFTs including  bilirubin of 4    COMPARISON: None.    FINDINGS:      - LIVER: Normal size. Moderately increased echogenicity consistent with  diffuse fatty liver. No focal lesions. There is no intrahepatic bile duct  dilatation.     - GALL BLADDER: Normally distended without evidence of stone or sludge.      - COMMON BILE DUCT: Borderline size at 7 mm. There is no intrahepatic bile  duct dilatation.     - PANCREAS: Head and neck are partially seen and unremarkable. Body and  tail are not seen due to stomach gas.     - RIGHT KIDNEY: Normal size 10-11 cm and appearance.      - ASCITES: None    IMPRESSION:  1. Diffuse fatty liver  2. Borderline common bile duct at 7 mm. MRCP is recommended rule out distal  common bile duct stone  3. No gallstones.      DVT RISK FACTORS HAVE BEN ASSESSED AND PROPHYLAXIS ORDERED (SEE RUBYONLINE - REFERENCE TOOLS - MD, DVT PROPHY OR POCKET CARD)    Assessment/Plan:     --Acute Pancreatitis:  High rate IV fluids, pain management per our discussion.  Continue to monitor lipase and LFTs.  NPO for now.  No evidence of gallstones.  Borderline common bile duct at 7 mm per MRCP. No stones.  Will check lipid panel.  This may be secondary to alcohol abuse.  Will also give IV  Protonix for epigastric pain    --AKI:  Secondary to dehydration.  Giving high rate IV fluids for the pancreatitis.  Plan will recheck renal function in the a.m.Marland Kitchen    --Dehydration:  Secondary to intractable nausea and vomiting from acute pancreatitis.  Will give IV fluids and monitor patient closely.  Supportive care as needed.  Will give IV antiemetics    --Alcohol abuse:  Patient wishes for cessation.  Supportive care is necessary at this time.  Will give IV Ativan for alcohol withdrawal symptoms per the CIWA protocol.  Banana bag daily x3.  Recommend obtaining resources for outpatient assistance with cessation prior to the patient's discharge.    --Subutex use:  History of Heroin use in remission for 2 years.  Patient takes Subutex.  We admitted agreement that the patient is willing to take IV opiates until the acute pancreatitis has improved and patient is able to return to oral medications at which time she will resume her Subutex.      DVT prophylaxis:  Contraindicated at this time  GI prophylaxis: Protonix.  Code status:  Full code.        Portions of this note may be dictated using voice recognition software or a dictation service. Variances in spelling and vocabulary are possible and unintentional. Not all errors are caught/corrected. Please notify the Thereasa Parkin if any discrepancies are noted or if the meaning of any statement is not clear.       ------------------------------------------------------------------------------------------------------------  I discussed the case with Candelaria Celeste, NP and agree with the plan documented above.     Dorthy Cooler, MD

## 2016-07-23 NOTE — ED Triage Notes (Signed)
C/o abdominal pain and vomiting for months, reports hx hepatitis.

## 2016-07-23 NOTE — ED Nurses Note (Signed)
MRI screening form completed and faxed.

## 2016-07-24 ENCOUNTER — Inpatient Hospital Stay (HOSPITAL_BASED_OUTPATIENT_CLINIC_OR_DEPARTMENT_OTHER): Payer: Self-pay

## 2016-07-24 DIAGNOSIS — D696 Thrombocytopenia, unspecified: Secondary | ICD-10-CM | POA: Insufficient documentation

## 2016-07-24 LAB — COMPREHENSIVE METABOLIC PROFILE - BMC/JMC ONLY
ALBUMIN/GLOBULIN RATIO: 1.3 (ref 0.8–2.0)
ALBUMIN: 3 g/dL — ABNORMAL LOW (ref 3.5–5.0)
ALKALINE PHOSPHATASE: 106 U/L (ref 38–126)
ALT (SGPT): 35 U/L (ref 14–54)
ANION GAP: 8 mmol/L (ref 3–11)
AST (SGOT): 110 U/L — ABNORMAL HIGH (ref 15–41)
BILIRUBIN TOTAL: 3.7 mg/dL — ABNORMAL HIGH (ref 0.3–1.2)
BUN/CREA RATIO: 16 (ref 6–22)
BUN: 12 mg/dL (ref 6–20)
CALCIUM: 8.6 mg/dL (ref 8.6–10.3)
CHLORIDE: 103 mmol/L (ref 101–111)
CO2 TOTAL: 27 mmol/L (ref 22–32)
CREATININE: 0.75 mg/dL (ref 0.44–1.00)
ESTIMATED GFR: >60 mL/min/1.73mˆ2 (ref >60)
GLUCOSE: 69 mg/dL — ABNORMAL LOW (ref 70–110)
POTASSIUM: 4.3 mmol/L (ref 3.4–5.1)
PROTEIN TOTAL: 5.4 g/dL — ABNORMAL LOW (ref 6.4–8.3)
PROTEIN TOTAL: 5.4 g/dL — ABNORMAL LOW (ref 6.4–8.3)
SODIUM: 138 mmol/L (ref 136–145)

## 2016-07-24 LAB — CBC WITH DIFF
BASOPHIL #: 0 x10ˆ3/uL (ref 0.00–0.10)
BASOPHIL %: 0 % (ref 0–3)
EOSINOPHIL #: 0.1 x10ˆ3/uL (ref 0.00–0.50)
EOSINOPHIL %: 3 % (ref 0–5)
HCT: 39.4 % (ref 36.0–45.0)
HGB: 13.4 g/dL (ref 12.0–15.5)
HGB: 13.4 g/dL (ref 12.0–15.5)
LYMPHOCYTE #: 1 x10ˆ3/uL (ref 1.00–4.80)
LYMPHOCYTE %: 20 % (ref 15–43)
MCH: 32.4 pg (ref 27.5–33.2)
MCHC: 34 g/dL (ref 32.0–36.0)
MCV: 95.3 fL (ref 82.0–97.0)
MONOCYTE #: 0.3 x10ˆ3/uL (ref 0.20–0.90)
MONOCYTE %: 6 % (ref 5–12)
MPV: 10.3 fL (ref 7.4–10.5)
NEUTROPHIL #: 3.6 x10ˆ3/uL (ref 1.50–6.50)
NEUTROPHIL %: 71 % (ref 43–76)
PLATELETS: 72 x10ˆ3/uL — ABNORMAL LOW (ref 150–450)
RBC: 4.13 x10ˆ6/uL (ref 4.00–5.10)
RDW: 14 % (ref 11.0–16.0)
WBC: 5.1 x10?3/uL (ref 4.0–11.0)
WBC: 5.1 x10ˆ3/uL (ref 4.0–11.0)

## 2016-07-24 LAB — POC FINGERSTICK GLUCOSE - BMC/JMC (RESULTS): GLUCOSE, POC: 85 mg/dL (ref 60–100)

## 2016-07-24 LAB — MAGNESIUM: MAGNESIUM: 2.1 mg/dL (ref 1.4–2.1)

## 2016-07-24 LAB — FERRITIN: FERRITIN: 227 ng/mL (ref 11–307)

## 2016-07-24 LAB — IRON TRANSFERRIN AND TIBC
IRON (TRANSFERRIN) SATURATION: 35 % (ref 15–50)
IRON: 73 ug/dL (ref 28–170)
TRANSFERRIN: 147 mg/dL — ABNORMAL LOW (ref 192–282)

## 2016-07-24 LAB — AMYLASE: AMYLASE: 435 U/L — ABNORMAL HIGH (ref 5–100)

## 2016-07-24 MED ORDER — GADOTERIDOL 279.3 MG/ML INTRAVENOUS SOLUTION
10.00 mL | INTRAVENOUS | Status: AC
Start: 2016-07-24 — End: 2016-07-24
  Administered 2016-07-24: 10 mL via INTRAVENOUS
  Filled 2016-07-24: qty 10

## 2016-07-24 MED ORDER — PANTOPRAZOLE 40 MG INTRAVENOUS SOLUTION
40.00 mg | INTRAVENOUS | Status: AC
Start: 2016-07-24 — End: 2016-07-24
  Administered 2016-07-24: 40 mg via INTRAVENOUS
  Filled 2016-07-24: qty 10

## 2016-07-24 MED ORDER — PANTOPRAZOLE 40 MG INTRAVENOUS SOLUTION
40.00 mg | Freq: Two times a day (BID) | INTRAVENOUS | Status: DC
Start: 2016-07-24 — End: 2016-07-27
  Administered 2016-07-24 – 2016-07-27 (×7): 40 mg via INTRAVENOUS
  Filled 2016-07-24 (×7): qty 10

## 2016-07-24 MED ADMIN — potassium chloride 20 mEq/L in dextrose 5 %-0.45 % sodium chloride IV: INTRAVENOUS | @ 01:00:00

## 2016-07-24 MED ADMIN — lamiVUDine 150 mg-zidovudine 300 mg tablet: INTRAVENOUS

## 2016-07-24 MED ADMIN — sodium chloride 0.9 % (flush) injection syringe: INTRAVENOUS | @ 09:00:00

## 2016-07-24 NOTE — Progress Notes (Signed)
[facility]  [facility]  [place], [place] [id number]    IP PROGRESS NOTE      [name],[name]  Date of Admission:  07/23/2016  Date of Birth:  10/08/1991  Date of Service:  07/24/2016    Chief Complaint:  Since she could be better, patient's fiancee  Subjective: at the bedside with the permission of the patient    Vital Signs:  Temp (24hrs) Max:37.4 C (99.3 F)      Temperature: 37.1 C (98.7 F)  BP (Non-Invasive): 122/84  Heart Rate: 98  Respiratory Rate: 18  Pain Score (Numeric, Faces): 8  SpO2-1: 99 %    Current Medications:    Current Facility-Administered Medications:  gadoteridol (PROHANCE) 279.3 mg/mL injection 10 mL Intravenous Give in MRI   HYDROmorphone (DILAUDID) 2 mg/mL injection 1 mg Intravenous Q3H PRN   LORazepam (ATIVAN) 2 mg/mL injection 2 mg Intravenous Q15 Min PRN   LORazepam (ATIVAN) 2 mg/mL injection 2 mg Intravenous Q6H PRN   LR premix infusion  Intravenous Continuous   morphine 4 mg/mL injection 4 mg Intravenous Q5 Min PRN   nicotine (NICODERM CQ) transdermal patch (mg/24 hr) 14 mg Transdermal Daily   nitroGLYCERIN (NITROSTAT) sublingual tablet 0.4 mg Sublingual Q5 Min PRN   NS 1,000 mL with folic acid 1 mg, thiamine 100 mg, multivitamin w/ vitamin K 10 mL, magnesium sulfate 1 g infusion  Intravenous Q24H   NS flush syringe 10 mL Intravenous Q8HRS   NS flush syringe 10 mL Intravenous Q1H PRN   ondansetron (ZOFRAN) 2 mg/mL injection 4 mg Intravenous Q8H PRN   pantoprazole (PROTONIX) injection 40 mg Intravenous Q12H       Today's Physical Exam:  General: appears in good health. No distress.   Eyes: Pupils equal and round, reactive to light and accomodation.   HENT:Head atraumatic and normocephalic   Neck: No JVD or thyromegaly or lymphadenopathy   Lungs: CTAB, non labored breathing, no rales or wheezing.    Cardiovascular: regular rate and rhythm, S1, S2 normal, no murmur,   Abdomen:  Mild epigastric pain but however no guarding or rigidity or rebound bowel sounds are present.      Extremities: extremities normal, atraumatic, no cyanosis or edema   Skin: Skin warm and dry   Neurologic: Grossly normal   Psychiatric: Normal affect, behavior,         I/O:  I/O last 24 hours:    Intake/Output Summary (Last 24 hours) at 07/24/16 1433  Last data filed at 07/24/16 1343   Gross per 24 hour   Intake              994 ml   Output                0 ml   Net              994 ml     I/O current shift:  09/23 0800 - 09/23 1559  In: 994 [I.V.:994]  Out: -       Labs  Please indicate ordered or reviewed)  Reviewed: I have reviewed all lab results.        Radiology Tests (Please indicate ordered or reviewed)  Reviewed: MRI:   IMPRESSION:  IMPRESSION:  1. Changes consistent with moderate to severe acute pancreatitis.  2. No evidence of gallstones or common bile duct stones.      Problem List:  Active Hospital Problems   (*Primary Problem)    Diagnosis    Acute pancreatitis  Assessment/ Plan:     Acute Pancreatitis:   possibly secondary to alcohol.  Keep the patient NPO for now MRI has been noted no common bile duct stones are no gallstones.  Patient has been seen by Dr. Rise Patience he ordered MRI.  Patient remains afebrile there is no temperature spikes.  Continue with IV fluids.      --AKI:  Secondary to dehydration.    Improved with IV fluids.    --Dehydration:  Secondary to intractable nausea and vomiting from acute pancreatitis.  Will give IV fluids and monitor patient closely.  Supportive care as needed.  Will give IV antiemetics    --Alcohol abuse:  Patient wishes for cessation.  Supportive care is necessary at this time.  Will give IV Ativan for alcohol withdrawal symptoms per the CIWA protocol.  Banana bag daily x3.  Recommend obtaining resources for outpatient assistance with cessation prior to the patient's discharge.    --Subutex use:  History of Heroin use in remission for 2 years.  Patient takes Subutex.  We admitted agreement that the patient is willing to take IV opiates until the acute  pancreatitis has improved and patient is able to return to oral medications at which time she will resume her Subutex.    Transaminitis which seems to be improving.  MRCP noted. No gallstones no evidence of common bile duct stones.  This possibly is related to alcohol.  Discussed plan of care with the patient and the patient's fiancee at the bedside with the permission of the patient.  DVT prophylaxis:  SCD  GI prophylaxis: Protonix.  Code status:  Full code.

## 2016-07-24 NOTE — Care Plan (Signed)
Problem: Pain, Acute (Adult)  Goal: Acceptable Pain Control/Comfort Level  Patient will demonstrate the desired outcomes by discharge/transition of care.  Outcome: Ongoing (see interventions/notes)    07/24/16 1540   Pain, Acute (Adult)   Acceptable Pain Control/Comfort Level making progress toward outcome   Medicated prn for pain control.  IVF infusing per order.  Remains NPO status.

## 2016-07-24 NOTE — Nurses Notes (Signed)
Patient sees Dr [place]Farrah [place] [facility]. His direct line is [phone].

## 2016-07-24 NOTE — Nurses Notes (Signed)
Assumed care of patient. Received bedside report from Leodis SiasLisa Burns, RN. Patient sat in bed, texting, talkative with IV fluids running.

## 2016-07-24 NOTE — Consults (Signed)
[facility]  GI CONSULT      [name] CrutchShatzer,Tawana, 25 y.o. 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 MRCP suggests pancreatitis,no cbd stones.Elevated lipase.Pt drinks a lot of wine daily.Marland Kitchen.Ultrasound suggests fatty liver.apparently history of Hepatitis C.the patient has been with abdominal apian,nausea and vomiting for months.Pt drinkls 1.5 bottles of wine daily most recently was up to 3 bottles a day in  past.  Past Medical History  No current outpatient prescriptions on file.     No Known Allergies  Past Medical History:   Diagnosis Date   . ETOH abuse    . Hepatitis C    . Heroin abuse          Past Surgical History:   Procedure Laterality Date   . HX TONSILLECTOMY           Family Medical History     None            Social History     Social History   . Marital status: Single     Spouse name: N/A   . Number of children: N/A   . Years of education: N/A     Social History Main Topics   . Smoking status: Current Every Day Smoker     Packs/day: 1.00     Types: Cigarettes   . Smokeless tobacco: Not on file   .  Alcohol use Yes      Comment: daily 2 large bottles of wine   . Drug use: No      Comment: hx heroin   . Sexual activity: Not on file     Other Topics Concern   . Not on file     Social History Narrative   . No narrative on file    of IV drug abuse.    Past Medical History:   Diagnosis Date   . ETOH abuse    . Hepatitis C    . Heroin abuse        Past Surgical History:   Procedure Laterality Date   . HX TONSILLECTOMY         Medications Prior to Admission     Prescriptions    buprenorphine HCl (SUBUTEX) 8 mg Sublingual Tablet, Sublingual    8 mg by Sublingual route Twice daily    buprenorphine-naloxone (SUBOXONE) 8-2 mg Sublingual Tablet, Sublingual    2 Tabs by Sublingual route Twice daily          Current Facility-Administered Medications:  HYDROmorphone (DILAUDID) 2 mg/mL injection 1 mg Intravenous Q3H PRN   LORazepam (ATIVAN) 2 mg/mL injection 2 mg Intravenous Q15 Min PRN   LORazepam (ATIVAN) 2 mg/mL injection 2 mg Intravenous  Q6H PRN   LR premix infusion  Intravenous Continuous   morphine 4 mg/mL injection 4 mg Intravenous Q5 Min PRN   nicotine (NICODERM CQ) transdermal patch (mg/24 hr) 14 mg Transdermal Daily   nitroGLYCERIN (NITROSTAT) sublingual tablet 0.4 mg Sublingual Q5 Min PRN   NS 1,000 mL with folic acid 1 mg, thiamine 100 mg, multivitamin w/ vitamin K 10 mL, magnesium sulfate 1 g infusion  Intravenous Q24H   NS flush syringe 10 mL Intravenous Q8HRS   NS flush syringe 10 mL Intravenous Q1H PRN   ondansetron (ZOFRAN) 2 mg/mL injection 4 mg Intravenous Q8H PRN   pantoprazole (PROTONIX) injection 40 mg Intravenous Q12H   No Known Allergies  Family History  Father with etoh related cirrhosis    Social History  Social History     Social History   . Marital status: Single     Spouse name: N/A   . Number of children: N/A   . Years of education: N/A     Occupational History   . Not on file.     Social History Main Topics   . Smoking status: Current Every Day Smoker     Packs/day: 1.00     Types: Cigarettes   .  Smokeless tobacco: Not on file   . Alcohol use Yes      Comment: daily 2 large bottles of wine   . Drug use: No      Comment: hx heroin   . Sexual activity: Not on file     Other Topics Concern   . Not on file     Social History Narrative   . No narrative on file       ROS:     Constitutional: positive for fatigue  Eyes: positive for icterus  Ears, nose, mouth, throat, and face: negative  Respiratory: negative  Cardiovascular: negative  Gastrointestinal: positive for nausea, vomiting, diarrhea, abdominal pain and recent [past hematochezia and black stool may be peptobismol related, negative for dysphagia and odynophagia    Integument/breast: negative  Hematologic/lymphatic: negative  Musculoskeletal:negative  Neurological: negative  Behavioral/Psych: negative  Endocrine: negative  Allergic/Immunologic: negative        EXAM:Temperature: 37.1 C (98.7 F)  Heart Rate: 98  BP (Non-Invasive): 122/84  Respiratory Rate: 18  SpO2-1: 99 %  Pain Score (Numeric, Faces): 5  General: mild distress  Eyes: Sclera icteric.     Neck: supple, symmetrical, trachea midline  Lungs: Clear to auscultation bilaterally.   Cardiovascular: regular rate and rhythm  Abdomen: soft,upper abdominal tenderness more left sided,no rebound or guarding    Extremities: No cyanosis or edema  Skin: Skin warm and dry  Neurologic: Grossly normal  Lymphatics: No lymphadenopathy, in supraclavicular regions  Psychiatric: Normal    Labs:  I have reviewed all lab results.    Imaging Studies:  ultrasound amd mrcp reviewed.    Zara Chess, MD

## 2016-07-24 NOTE — Care Plan (Signed)
Problem: Patient Care Overview (Adult,OB)  Goal: Plan of Care Review(Adult,OB)  The patient and/or their representative will communicate an understanding of their plan of care   Outcome: Ongoing (see interventions/notes)    Problem: Depression (Adult,Obstetrics,Pediatric)  Goal: Identify Related Risk Factors and Signs and Symptoms  Related risk factors and signs and symptoms are identified upon initiation of Human Response Clinical Practice Guideline (CPG).   Outcome: Ongoing (see interventions/notes)    07/24/16 0424   Depression   Related Risk Factors (Depression) history of depression;medication effects;sleep disturbance;substance use/abuse   Signs and Symptoms (Depression) appetite changes;fatigue/loss of energy;irritability;sleep disturbance;substance use/abuse;weight gain or loss, excessive       Goal: Establish/Maintain Self-Care Routine  Patient will demonstrate the desired outcomes by discharge/transition of care.   Outcome: Ongoing (see interventions/notes)    07/24/16 0424   Depression (Adult,Obstetrics,Pediatric)   Establish/Maintain Self-Care Routine making progress toward outcome         Problem: Fall Risk (Adult)  Goal: Absence of Falls  Patient will demonstrate the desired outcomes by discharge/transition of care.   Outcome: Ongoing (see interventions/notes)    Comments:   Care Plan Note     Medications and plan of care discussed with patient and significant other who verbalized understanding. Unit package given to patient and explained. CIWA-AR score 10 on admission. Fall risk score 10 on admission. Ativan administered per PRN orders and documented on MAR. Dilaudid given for pain in abdomen and back. Patient is currently sleeping between care. [facility] under high pressure, Pt declines the need to find a new IV site at this time.         Levonne HubertLindsey Jashawna Reever, RN

## 2016-07-24 NOTE — Nurses Notes (Signed)
07/24/16 0730   SBAR Handoff Report   Handoff report given to: Lisa Burns,RN   Time SBAR given: 0730

## 2016-07-25 LAB — COMPREHENSIVE METABOLIC PROFILE - BMC/JMC ONLY
ALBUMIN/GLOBULIN RATIO: 1.3 (ref 0.8–2.0)
ALBUMIN: 3.1 g/dL — ABNORMAL LOW (ref 3.5–5.0)
ALKALINE PHOSPHATASE: 103 U/L (ref 38–126)
ALT (SGPT): 29 U/L (ref 14–54)
ANION GAP: 7 mmol/L (ref 3–11)
ANION GAP: 7 mmol/L (ref 3–11)
AST (SGOT): 77 U/L — ABNORMAL HIGH (ref 15–41)
BILIRUBIN TOTAL: 2.1 mg/dL — ABNORMAL HIGH (ref 0.3–1.2)
BILIRUBIN TOTAL: 2.1 mg/dL — ABNORMAL HIGH (ref 0.3–1.2)
BUN/CREA RATIO: 7 (ref 6–22)
CALCIUM: 8.6 mg/dL (ref 8.6–10.3)
CHLORIDE: 105 mmol/L (ref 101–111)
CO2 TOTAL: 25 mmol/L (ref 22–32)
CREATININE: 0.69 mg/dL (ref 0.44–1.00)
CREATININE: 0.69 mg/dL (ref 0.44–1.00)
ESTIMATED GFR: >60 mL/min/1.73mˆ2 (ref >60)
GLUCOSE: 100 mg/dL (ref 70–110)
POTASSIUM: 3.6 mmol/L (ref 3.4–5.1)
SODIUM: 137 mmol/L (ref 136–145)

## 2016-07-25 LAB — SCAN DIFFERENTIAL
PLATELET ESTIMATE: DECREASED
RBC MORPHOLOGY COMMENT: NORMAL
WBC MORPHOLOGY COMMENT: NORMAL

## 2016-07-25 LAB — POC FINGERSTICK GLUCOSE - BMC/JMC (RESULTS)
GLUCOSE, POC: 121 mg/dL — ABNORMAL HIGH (ref 60–100)
GLUCOSE, POC: 128 mg/dL — ABNORMAL HIGH (ref 60–100)
GLUCOSE, POC: 82 mg/dL (ref 60–100)
GLUCOSE, POC: 90 mg/dL (ref 60–100)

## 2016-07-25 LAB — CBC WITH DIFF
BASOPHIL #: 0 x10ˆ3/uL (ref 0.00–0.10)
BASOPHIL %: 0 % (ref 0–3)
EOSINOPHIL #: 0.2 x10?3/uL (ref 0.00–0.50)
EOSINOPHIL %: 4 % (ref 0–5)
HCT: 39.7 % (ref 36.0–45.0)
HGB: 13.2 g/dL (ref 12.0–15.5)
LYMPHOCYTE %: 22 % (ref 15–43)
MCH: 32 pg (ref 27.5–33.2)
MCHC: 33.2 g/dL (ref 32.0–36.0)
MCV: 96.4 fL (ref 82.0–97.0)
MONOCYTE %: 7 % (ref 5–12)
MPV: 10.5 fL (ref 7.4–10.5)
NEUTROPHIL #: 3.2 x10ˆ3/uL (ref 1.50–6.50)
PLATELETS: 74 x10ˆ3/uL — ABNORMAL LOW (ref 150–450)
WBC: 4.7 x10ˆ3/uL (ref 4.0–11.0)

## 2016-07-25 LAB — PT/INR
INR: 1.2
PROTHROMBIN TIME: 13.3 s — ABNORMAL HIGH (ref 9.4–12.5)

## 2016-07-25 LAB — LIPASE: LIPASE: 720 U/L — ABNORMAL HIGH (ref 22–51)

## 2016-07-25 LAB — AMYLASE
AMYLASE: 244 U/L — ABNORMAL HIGH (ref 5–100)
AMYLASE: 244 U/L — ABNORMAL HIGH (ref 5–100)

## 2016-07-25 LAB — URINE CULTURE: URINE CULTURE: NO GROWTH

## 2016-07-25 MED ORDER — HYDROMORPHONE 2 MG/ML INJECTION SYRINGE
1.50 mg | INJECTION | INTRAMUSCULAR | Status: DC | PRN
Start: 2016-07-25 — End: 2016-07-26
  Administered 2016-07-25 – 2016-07-26 (×9): 1.5 mg via INTRAVENOUS
  Filled 2016-07-25 (×10): qty 1

## 2016-07-25 MED ADMIN — IOVERSOL 320 MG/ML (OPTIRAY 320) IN NS INJECTION: INTRAVENOUS | @ 21:00:00 | NDC 00019132306

## 2016-07-25 MED ADMIN — lactated Ringers intravenous solution: INTRAVENOUS | @ 05:00:00 | NDC 00338011704

## 2016-07-25 MED ADMIN — electrolyte-A intravenous solution: INTRAVENOUS | @ 21:00:00 | NDC 00338022104

## 2016-07-25 MED ADMIN — albumin, human 5 % intravenous solution: INTRAVENOUS | @ 09:00:00 | NDC 00944049101

## 2016-07-25 MED ADMIN — sodium chloride 0.9 % intravenous solution: TRANSDERMAL | NDC 00338004904

## 2016-07-25 NOTE — Behavioral Health (Signed)
Met with the pt to discuss her depression score as well as her alcoholism. She states that she wants to stop drinking, but is not interested in any inpatient programs. She states that she would prefer outpatient. Pt was referred to [facility] for substance abuse and mental health treatment due to her being self pay. She can apply for [facility] through them.    She states that she does have severe depression but denies any suicidal thoughts or plans to end her life. She says that she uses drinking to mask her emotional pain. She did not wish to elaborate. She did endorse hope that she can overcome her alcohol addiction because she got off heroin in the past.    Pt was provided with resources for [facility] and instructed to go to their walk-in clinic. She denies any other concerns at this time.

## 2016-07-25 NOTE — Ancillary Notes (Signed)
[facility]  Medical Nutrition Therapy Assessment      Reason for Assessment: High Risk Notification: Weight loss/BMI < 20  Priority level:  1  Follow in 3 days.    SUBJECTIVE :   Still with some abdominal pain. Remains NPO since admission.    OBJECTIVE:  Height: 162.6 cm (5\' 4" )  Weight: 49 kg (108 lb)  BMI (Calculated): 18.58    IBW:  55 kg  ABW:  n/a  UBW:  Per pt, loss of 18-20# in recent months.      Labs:   I have reviewed all lab results.  Lab Results for Last 24 Hours:    Results for orders placed or performed during the hospital encounter of 07/23/16 (from the past 24 hour(s))   FERRITIN   Result Value Ref Range    FERRITIN 227 11 - 307 ng/mL   IRON TRANSFERRIN AND TIBC   Result Value Ref Range    TOTAL IRON BINDING CAPACITY 210 ug/dL    IRON (TRANSFERRIN) SATURATION 35 15 - 50 %    IRON 73 28 - 170 ug/dL    TRANSFERRIN 161 (L) 192 - 282 mg/dL   MAGNESIUM   Result Value Ref Range    MAGNESIUM 2.1 1.4 - 2.1 mg/dL   POC FINGERSTICK GLUCOSE - [facility] (RESULTS)   Result Value Ref Range    GLUCOSE, POC 85 60 - 100 mg/dL   POC FINGERSTICK GLUCOSE - [facility] (RESULTS)   Result Value Ref Range    GLUCOSE, POC 95 60 - 100 mg/dL   POC FINGERSTICK GLUCOSE - [facility] (RESULTS)   Result Value Ref Range    GLUCOSE, POC 88 60 - 100 mg/dL   COMPREHENSIVE METABOLIC PROFILE - [facility] ONLY   Result Value Ref Range    SODIUM 137 136 - 145 mmol/L    POTASSIUM 3.6 3.4 - 5.1 mmol/L    CHLORIDE 105 101 - 111 mmol/L    CO2 TOTAL 25 22 - 32 mmol/L    ANION GAP 7 3 - 11 mmol/L    BUN 5 (L) 6 - 20 mg/dL    CREATININE 0.96 0.45 - 1.00 mg/dL    BUN/CREA RATIO 7 6 - 22    ESTIMATED GFR >60 >60 mL/min/1.25m2    ALBUMIN 3.1 (L) 3.5 - 5.0 g/dL    CALCIUM 8.6 8.6 - 40.9 mg/dL    GLUCOSE 811 70 - 914 mg/dL    ALKALINE PHOSPHATASE 103 38 - 126 U/L    ALT (SGPT) 29 14 - 54 U/L    AST (SGOT) 77 (H) 15 - 41 U/L    BILIRUBIN TOTAL 2.1 (H) 0.3 - 1.2 mg/dL    PROTEIN TOTAL 5.5 (L) 6.4 - 8.3 g/dL    ALBUMIN/GLOBULIN RATIO 1.3 0.8 - 2.0      PT/INR   Result Value Ref Range    PROTHROMBIN TIME 13.3 (H) 9.4 - 12.5 seconds    INR 1.20    AMYLASE   Result Value Ref Range    AMYLASE 244 (H) 5 - 100 U/L   LIPASE   Result Value Ref Range    LIPASE 720 (H) 22 - 51 U/L   CBC WITH DIFF   Result Value Ref Range    WBC 4.7 4.0 - 11.0 x103/uL    RBC 4.11 4.00 - 5.10 x106/uL    HGB 13.2 12.0 - 15.5 g/dL    HCT 78.2 95.6 - 21.3 %    MCV 96.4 82.0 - 97.0  fL    MCH 32.0 27.5 - 33.2 pg    MCHC 33.2 32.0 - 36.0 g/dL    RDW 16.1 09.6 - 04.5 %    PLATELETS 74 (L) 150 - 450 x103/uL    MPV 10.5 7.4 - 10.5 fL    NEUTROPHIL % 67 43 - 76 %    LYMPHOCYTE % 22 15 - 43 %    MONOCYTE % 7 5 - 12 %    EOSINOPHIL % 4 0 - 5 %    BASOPHIL % 0 0 - 3 %    NEUTROPHIL # 3.20 1.50 - 6.50 x103/uL    LYMPHOCYTE # 1.00 1.00 - 4.80 x103/uL    MONOCYTE # 0.30 0.20 - 0.90 x103/uL    EOSINOPHIL # 0.20 0.00 - 0.50 x103/uL    BASOPHIL # 0.00 0.00 - 0.10 x103/uL   SCAN DIFFERENTIAL   Result Value Ref Range    PLATELET ESTIMATE Decreased     PLATELET CLUMPS Present     RBC MORPHOLOGY COMMENT Normal     WBC MORPHOLOGY COMMENT Normal    POC FINGERSTICK GLUCOSE - [facility] (RESULTS)   Result Value Ref Range    GLUCOSE, POC 121 (H) 60 - 100 mg/dL   POC FINGERSTICK GLUCOSE - [facility] (RESULTS)   Result Value Ref Range    GLUCOSE, POC 128 (H) 60 - 100 mg/dL       Meds:   Current Facility-Administered Medications:  HYDROmorphone (DILAUDID) 2 mg/mL injection 1.5 mg Intravenous Q3H PRN   LORazepam (ATIVAN) 2 mg/mL injection 2 mg Intravenous Q15 Min PRN   LORazepam (ATIVAN) 2 mg/mL injection 2 mg Intravenous Q6H PRN   LR premix infusion  Intravenous Continuous   morphine 4 mg/mL injection 4 mg Intravenous Q5 Min PRN   nicotine (NICODERM CQ) transdermal patch (mg/24 hr) 14 mg Transdermal Daily   nitroGLYCERIN (NITROSTAT) sublingual tablet 0.4 mg Sublingual Q5 Min PRN   NS 1,000 mL with folic acid 1 mg, thiamine 100 mg, multivitamin w/ vitamin K 10 mL, magnesium sulfate 1 g infusion  Intravenous Q24H   NS  flush syringe 10 mL Intravenous Q8HRS   NS flush syringe 10 mL Intravenous Q1H PRN   ondansetron (ZOFRAN) 2 mg/mL injection 4 mg Intravenous Q8H PRN   pantoprazole (PROTONIX) injection 40 mg Intravenous Q12H     Past Medical History:   Diagnosis Date    ETOH abuse     Hepatitis C     Heroin abuse        Comments:   Pt is a 25 yo female with acute pancreatitis likely brought on by ETOH abuse. Also has hx of IV drug abuse (in remission).  GI - no necrotizing pancreatitis.   Braden = 19; nutrition = 3; probably inadequate  NPO since admission. Clear Liquids once pain improves.   Nutritional needs estimated at:   1470 cal (30 cal/kg) CBW   74 gm Protein (1.5 gm/kg) CBW   1470 ml Fluids (30 ml/kg) CBW    Nutritional Education needs:  None    DIAGNOSIS / INTERVENTION  Nutrition Diagnosis: Altered nutrition related lab values  Inadequate protein-energy intake  Involuntary weight loss ; related to Pathophysiological  Altered GI length/function/motility and Liver dysfunction ; as evidenced by Abnormal Lab Values ie: Bili 2.1, Lipase 720, Unintentional weight loss, BMI less than 19 and Decreased oral intake    Nutrition Intervention :   1. Collaboration with other providers  2. Diet advancement   Nutrition Prescription:  1. Diet advancement per GI as pt tolerates.   2. Once diet able to be advanced, recommend REGULAR/low fat diet.        Goals:  Maintain Weight of CBW - pt should have a goal to gain 1-2 lbs per week once d/c  Meet estimated needs for nutrients/fluids  Transition to oral feeds  Tolerate least restrictive diet    Monitoring / Evaluation: Monitor: Weight Status and Pertinent Labs LFT's  Tolerance of : diet          Isabell JarvisLindsay Adysen Raphael, RDLD  07/25/2016, 11:25    Phone:  [phone]

## 2016-07-25 NOTE — Care Plan (Signed)
Problem: Patient Care Overview (Adult,OB)  Goal: Plan of Care Review(Adult,OB)  The patient and/or their representative will communicate an understanding of their plan of care   Outcome: Ongoing (see interventions/notes)    Problem: Depression (Adult,Obstetrics,Pediatric)  Goal: Improved/Stable Mood  Patient will demonstrate the desired outcomes by discharge/transition of care.   Outcome: Ongoing (see interventions/notes)    Problem: Fall Risk (Adult)  Goal: Absence of Falls  Patient will demonstrate the desired outcomes by discharge/transition of care.   Outcome: Ongoing (see interventions/notes)    Problem: Pain, Acute (Adult)  Goal: Acceptable Pain Control/Comfort Level  Patient will demonstrate the desired outcomes by discharge/transition of care.   Outcome: Ongoing (see interventions/notes)    Comments:   Care Plan Note     Medications and plan of care discussed with patient and significant other who verbalized understanding. Significant other was a bedside for most of the night participating in care. Remains free from falls during this shift, is stand by assist when ambulating to the bathroom. SCD's provided to the patient who can't tolerate the use due to feeling tired to the bed. Pain is 10/10 to abdomen that radiates around to her back, pain decreases to 5/10 for about an hour then increases therafter. CIWA-AR screening performed twice with scores of 13 and 15.     Levonne HubertLindsey Lurene Robley, RN

## 2016-07-25 NOTE — Nurses Notes (Signed)
Dr Jonette PesaKingree notified of patients perceived pain relief from PRN ordered Dilaudid. Dose increased.

## 2016-07-25 NOTE — Progress Notes (Signed)
[facility]  GI Consult  Follow Up Note      [name],[name], 25 y.o. female  Date of Service: 07/25/2016  Date of Birth:  12/24/1990    Hospital Day:  LOS: 2 days     Chief Complaint:  Pancreatitis/Etoh hepatitis  Subjective: Feels about the same still with upper abdominal pain    Objective:  Temperature: 36.9 C (98.4 F)  Heart Rate: 83  BP (Non-Invasive): 122/78  Respiratory Rate: 18  SpO2-1: 99 %  Pain Score (Numeric, Faces): 8  Constitutional:  mild distress  Respiratory:  Clear to auscultation bilaterally. , anteriorly  Cardiovascular:  regular rate and rhythm  Gastrointestinal:  soft,tenderness upper abdomen,no rebound or guarding  Neurologic:  Grossly normal    Labs:  I have reviewed all lab results.    Imaging Studies:  mri abd reviewed/no pancreatic necrosis    Assessment/Recommendations:  Amylase and lipase improving.,as are liver enzymes.However still with abdominal pain ,so keep npo except for meds,continue IV fluid support.Once pain improving then start clear liquids.[name] Chessimothy Milanie Rosenfield, MD

## 2016-07-25 NOTE — Nurses Notes (Signed)
Patient eating a few crackers and drinking sips of gatorade. Educated on NPO order and treatment of pancreatitis. Dr Jonette PesaKingree notified of Patient's irregular HR during assessment. ECG ordered.

## 2016-07-25 NOTE — Consults (Signed)
Consult completed. Please see full behavioral health note.

## 2016-07-25 NOTE — Nurses Notes (Signed)
07/25/16 0731   SBAR Handoff Report   Handoff report given to: Leodis SiasLisa Burns, RN   Time SBAR given: [id number]

## 2016-07-25 NOTE — Progress Notes (Signed)
[facility]  [facility]  [place], [place] [id number]    IP PROGRESS NOTE      [name],[name]  Date of Admission:  07/23/2016  Date of Birth:  12/20/1990  Date of Service:  07/25/2016    Chief Complaint:  Since she could be better, patient's fiancee  Subjective: at the bedside with the permission of the patient    Vital Signs:  Temp (24hrs) Max:37.1 C (98.8 F)      Temperature: 36.7 C (98.1 F)  BP (Non-Invasive): 125/87  MAP (Non-Invasive): 94 mmHG  Heart Rate: 68  Respiratory Rate: 18  Pain Score (Numeric, Faces): 8  SpO2-1: 100 %    Current Medications:    Current Facility-Administered Medications:  HYDROmorphone (DILAUDID) 2 mg/mL injection 1.5 mg Intravenous Q3H PRN   LORazepam (ATIVAN) 2 mg/mL injection 2 mg Intravenous Q15 Min PRN   LORazepam (ATIVAN) 2 mg/mL injection 2 mg Intravenous Q6H PRN   LR premix infusion  Intravenous Continuous   morphine 4 mg/mL injection 4 mg Intravenous Q5 Min PRN   nicotine (NICODERM CQ) transdermal patch (mg/24 hr) 14 mg Transdermal Daily   nitroGLYCERIN (NITROSTAT) sublingual tablet 0.4 mg Sublingual Q5 Min PRN   NS 1,000 mL with folic acid 1 mg, thiamine 100 mg, multivitamin w/ vitamin K 10 mL, magnesium sulfate 1 g infusion  Intravenous Q24H   NS flush syringe 10 mL Intravenous Q8HRS   NS flush syringe 10 mL Intravenous Q1H PRN   ondansetron (ZOFRAN) 2 mg/mL injection 4 mg Intravenous Q8H PRN   pantoprazole (PROTONIX) injection 40 mg Intravenous Q12H       Today's Physical Exam:  General: appears in good health. No distress.   Eyes: Pupils equal and round, reactive to light and accomodation.   HENT:Head atraumatic and normocephalic   Neck: No JVD or thyromegaly or lymphadenopathy   Lungs: CTAB, non labored breathing, no rales or wheezing.    Cardiovascular: regular rate and rhythm, S1, S2 normal, no murmur,   Abdomen:  Mild epigastric pain but however no guarding or rigidity or rebound bowel sounds are present.   Extremities: extremities normal, atraumatic, no  cyanosis or edema   Skin: Skin warm and dry   Neurologic: Grossly normal   Psychiatric: Normal affect, behavior,         I/O:  I/O last 24 hours:      Intake/Output Summary (Last 24 hours) at 07/25/16 1335  Last data filed at 07/25/16 1155   Gross per 24 hour   Intake             3086 ml   Output                0 ml   Net             3086 ml     I/O current shift:  09/24 0800 - 09/24 1559  In: 970 [I.V.:970]  Out: -       Labs  Please indicate ordered or reviewed)  Reviewed: I have reviewed all lab results.        Radiology Tests (Please indicate ordered or reviewed)  Reviewed: MRI:   IMPRESSION:  IMPRESSION:  1. Changes consistent with moderate to severe acute pancreatitis.  2. No evidence of gallstones or common bile duct stones.      Problem List:  Active Hospital Problems   (*Primary Problem)    Diagnosis    Thrombocytopenia Unspecified    Acute pancreatitis  Assessment/ Plan:     Acute Pancreatitis:   possibly secondary to alcohol.    Keep the patient NPO   MR CP no gallstones are no common bile duct stone  MRI No evidence for pancreatic necrosis. Fatty infiltration liver. No evidence  for liver cirrhosis.  Keep NPO for now in view of the ongoing abdominal pain.  Liver function test improving      --AKI:  Secondary to dehydration.    Improved with IV fluids.    --Dehydration:  Secondary to intractable nausea and vomiting from acute pancreatitis.  Will give IV fluids and monitor patient closely.  Supportive care as needed.  Will give IV antiemetics    --Alcohol abuse:  Patient wishes for cessation.  Supportive care is necessary at this time.  Will give IV Ativan for alcohol withdrawal symptoms per the CIWA protocol.  Banana bag daily x3.  Recommend obtaining resources for outpatient assistance with cessation prior to the patient's discharge.    --Subutex use:  History of Heroin use in remission for 2 years.  Patient takes Subutex.  We admitted agreement that the patient is willing to take IV opiates  until the acute pancreatitis has improved and patient is able to return to oral medications at which time she will resume her Subutex.  Still NPO    Transaminitis which seems to be improving.    MRCP noted. No gallstones no evidence of common bile duct stones.  This possibly is related to alcohol.    Discussed plan of care with the patient and the patient's fiancee at the bedside with the permission of the patient.  DVT prophylaxis:  SCD  GI prophylaxis: Protonix.  Code status:  Full code.

## 2016-07-25 NOTE — Care Plan (Signed)
Problem: Alcohol Withdrawal Acute, Risk/Actual (Adult)  Prevent and manage potential problems including: 1. effects of AWS (alcohol withdrawal syndrome) including DTs (delirium tremens) 2. situational response  Goal: Signs and Symptoms of Listed Potential Problems Will be Absent, Minimized or Managed (Alcohol Withdrawal Acute, Risk/Actual)  Signs and symptoms of listed potential problems will be absent, minimized or managed by discharge/transition of care (reference Alcohol Withdrawal Acute, Risk/Actual (Adult) CPG).  Outcome: Ongoing (see interventions/notes)  Medicated with ativan per orders and ciwa score. ciwa 12 and 10 today.  Continues with IVF per orders.  OOB with assist to the BR.

## 2016-07-25 NOTE — Nurses Notes (Signed)
Dr. Rise Patiencerphanides paged at patient request.  Awaiting call back.

## 2016-07-25 NOTE — Care Plan (Signed)
Problem: Pain, Acute (Adult)  Goal: Acceptable Pain Control/Comfort Level  Patient will demonstrate the desired outcomes by discharge/transition of care.   Continues to have 9/10 abdominal pain.  Medicated per prn orders.  Patient pleasant and cooperative.

## 2016-07-26 DIAGNOSIS — E876 Hypokalemia: Secondary | ICD-10-CM | POA: Insufficient documentation

## 2016-07-26 LAB — CBC WITH DIFF
BASOPHIL #: 0 x10ˆ3/uL (ref 0.00–0.10)
BASOPHIL %: 0 % (ref 0–3)
EOSINOPHIL #: 0.1 x10ˆ3/uL (ref 0.00–0.50)
EOSINOPHIL %: 3 % (ref 0–5)
HCT: 32.8 % — ABNORMAL LOW (ref 36.0–45.0)
HGB: 11 g/dL — ABNORMAL LOW (ref 12.0–15.5)
LYMPHOCYTE %: 30 % (ref 15–43)
MCH: 32 pg (ref 27.5–33.2)
MCHC: 33.6 g/dL (ref 32.0–36.0)
MCV: 95.5 fL (ref 82.0–97.0)
MONOCYTE #: 0.4 x10?3/uL (ref 0.20–0.90)
MONOCYTE %: 10 % (ref 5–12)
MONOCYTE %: 10 % (ref 5–12)
MPV: 10.1 fL (ref 7.4–10.5)
NEUTROPHIL #: 2.4 x10ˆ3/uL (ref 1.50–6.50)
NEUTROPHIL %: 57 % (ref 43–76)
PLATELETS: 59 x10ˆ3/uL — ABNORMAL LOW (ref 150–450)
RDW: 13.9 % (ref 11.0–16.0)
WBC: 4.3 x10ˆ3/uL (ref 4.0–11.0)

## 2016-07-26 LAB — LIPASE: LIPASE: 431 U/L — ABNORMAL HIGH (ref 22–51)

## 2016-07-26 LAB — IGG SUBCLASSES, SERUM
IGG 1: 610 mg/dL (ref 341–894)
IGG 2: 369 mg/dL (ref 171–632)
IGG 3: 65.6 mg/dL (ref 18.4–106.0)
IGG 4: 84.8 mg/dL (ref 2.4–121.0)
TOTAL IGG: 1220 mg/dL (ref 767–1590)

## 2016-07-26 LAB — COMPREHENSIVE METABOLIC PROFILE - BMC/JMC ONLY
ALBUMIN/GLOBULIN RATIO: 1.2 (ref 0.8–2.0)
ALKALINE PHOSPHATASE: 81 U/L (ref 38–126)
ALT (SGPT): 21 U/L (ref 14–54)
ANION GAP: 5 mmol/L (ref 3–11)
AST (SGOT): 46 U/L — ABNORMAL HIGH (ref 15–41)
BILIRUBIN TOTAL: 1 mg/dL (ref 0.3–1.2)
BUN: 5 mg/dL — ABNORMAL LOW (ref 6–20)
CALCIUM: 8.5 mg/dL — ABNORMAL LOW (ref 8.6–10.3)
CALCIUM: 8.5 mg/dL — ABNORMAL LOW (ref 8.6–10.3)
CHLORIDE: 108 mmol/L (ref 101–111)
CO2 TOTAL: 26 mmol/L (ref 22–32)
CREATININE: 0.64 mg/dL (ref 0.44–1.00)
GLUCOSE: 76 mg/dL (ref 70–110)
SODIUM: 139 mmol/L (ref 136–145)

## 2016-07-26 LAB — RENAL FUNCTION PANEL
ANION GAP: 5 mmol/L (ref 3–11)
BUN: 5 mg/dL — ABNORMAL LOW (ref 6–20)
CHLORIDE: 108 mmol/L (ref 101–111)
CO2 TOTAL: 26 mmol/L (ref 22–32)
CREATININE: 0.64 mg/dL (ref 0.44–1.00)
ESTIMATED GFR: >60 mL/min/1.73mˆ2 (ref >60)
GLUCOSE: 104 mg/dL (ref 70–110)
PHOSPHORUS: 4.1 mg/dL (ref 2.7–4.5)
POTASSIUM: 3.3 mmol/L — ABNORMAL LOW (ref 3.4–5.1)
SODIUM: 139 mmol/L (ref 136–145)

## 2016-07-26 LAB — POC FINGERSTICK GLUCOSE - BMC/JMC (RESULTS)
GLUCOSE, POC: 110 mg/dL — ABNORMAL HIGH (ref 60–100)
GLUCOSE, POC: 86 mg/dL (ref 60–100)
GLUCOSE, POC: 89 mg/dL (ref 60–100)
GLUCOSE, POC: 96 mg/dL (ref 60–100)
GLUCOSE, POC: 96 mg/dL (ref 60–100)

## 2016-07-26 LAB — HEPATITIS A (HAV) IGM ANTIBODY: HAV IGM: NEGATIVE

## 2016-07-26 LAB — MAGNESIUM: MAGNESIUM: 1.9 mg/dL (ref 1.4–2.1)

## 2016-07-26 LAB — HEPATITIS B SURFACE ANTIGEN: HBV SURFACE ANTIGEN QUALITATIVE: NEGATIVE

## 2016-07-26 LAB — AMYLASE: AMYLASE: 204 U/L — ABNORMAL HIGH (ref 5–100)

## 2016-07-26 LAB — HEPATITIS C ANTIBODY: HCV ANTIBODY QUALITATIVE: REACTIVE — AB

## 2016-07-26 MED ORDER — HYDROMORPHONE 2 MG/ML INJECTION SYRINGE
1.00 mg | INJECTION | INTRAMUSCULAR | Status: DC | PRN
Start: 2016-07-26 — End: 2016-07-27
  Administered 2016-07-26 – 2016-07-27 (×6): 1 mg via INTRAVENOUS
  Filled 2016-07-26 (×6): qty 1

## 2016-07-26 MED ORDER — MVI,ADULT NO.4 WITH VIT K 3300 UNIT-150 MCG/10 ML INTRAVENOUS SOLUTION
INTRAVENOUS | Status: DC
Start: 2016-07-26 — End: 2016-07-27
  Filled 2016-07-26: qty 1000

## 2016-07-26 MED ORDER — FAT EMULSION 20 % IV (SCHEDULED, NO OVERFILL)
10.0000 g | Freq: Once | INTRAVENOUS | Status: DC
Start: 2016-07-27 — End: 2016-07-27
  Administered 2016-07-27: 100 kcal via INTRAVENOUS
  Filled 2016-07-26: qty 250

## 2016-07-26 MED ORDER — CHLORDIAZEPOXIDE 10 MG CAPSULE
10.00 mg | ORAL_CAPSULE | Freq: Three times a day (TID) | ORAL | Status: DC
Start: 2016-07-26 — End: 2016-07-27
  Administered 2016-07-26 – 2016-07-27 (×3): 10 mg via ORAL
  Filled 2016-07-26 (×3): qty 1

## 2016-07-26 MED ORDER — PHARMACY TPN CONSULT
Freq: Every day | Status: DC | PRN
Start: 2016-07-26 — End: 2016-07-27
  Filled 2016-07-26: qty 1

## 2016-07-26 MED ORDER — BUPRENORPHINE HCL 2 MG SUBLINGUAL TABLET
8.00 mg | SUBLINGUAL_TABLET | Freq: Two times a day (BID) | SUBLINGUAL | Status: DC
Start: 2016-07-26 — End: 2016-07-26

## 2016-07-26 MED ADMIN — DEXTROSE 5% 1/2 NORMAL SALINE W/ ADDITIVES: INTRAVENOUS | NDC 00264761200

## 2016-07-26 MED ADMIN — LORazepam 2 mg/mL injection solution: INTRAVENOUS | @ 09:00:00

## 2016-07-26 MED ADMIN — sodium chloride 0.9 % (flush) injection syringe: INTRAVENOUS | @ 06:00:00

## 2016-07-26 NOTE — Nurses Notes (Signed)
Assuming care of patient at this time.  Page placed to Dr. Raina Minaummalla regarding her input for librium administration as pt continues to score high on CIWA assessment.

## 2016-07-26 NOTE — Progress Notes (Addendum)
[facility]  [facility]  [place], [place] [id number]    IP PROGRESS NOTE      [name],[name]  Date of Admission:  07/23/2016  Date of Birth:  10/17/1991  Date of Service:  07/26/2016    Chief Complaint:  Complaints of abdominal pain  Subjective: at the bedside with the permission of the patient    Vital Signs:  Temp (24hrs) Max:36.8 C (98.2 F)      Temperature: 36.7 C (98.1 F)  BP (Non-Invasive): 139/77  MAP (Non-Invasive): 93 mmHG  Heart Rate: 50  Respiratory Rate: 18  Pain Score (Numeric, Faces): 10  SpO2-1: 100 %    Current Medications:    Current Facility-Administered Medications:  adult standard parenteral nutrition with CLINIMIX E  Intravenous Continuous   chlordiazePOXIDE (LIBRIUM) capsule 10 mg Oral Q8H   [START ON 07/27/2016] fat emulsion (INTRALIPID) 20% infusion 10 g Intravenous Once   HYDROmorphone (DILAUDID) 2 mg/mL injection 1.5 mg Intravenous Q3H PRN   LORazepam (ATIVAN) 2 mg/mL injection 2 mg Intravenous Q15 Min PRN   LORazepam (ATIVAN) 2 mg/mL injection 2 mg Intravenous Q6H PRN   LR premix infusion  Intravenous Continuous   morphine 4 mg/mL injection 4 mg Intravenous Q5 Min PRN   nicotine (NICODERM CQ) transdermal patch (mg/24 hr) 14 mg Transdermal Daily   nitroGLYCERIN (NITROSTAT) sublingual tablet 0.4 mg Sublingual Q5 Min PRN   NS flush syringe 10 mL Intravenous Q8HRS   NS flush syringe 10 mL Intravenous Q1H PRN   ondansetron (ZOFRAN) 2 mg/mL injection 4 mg Intravenous Q8H PRN   pantoprazole (PROTONIX) injection 40 mg Intravenous Q12H   Pharmacy TPN Consult  Intravenous Daily PRN       Today's Physical Exam:  General: appears in good health. No distress.   Eyes: Pupils equal and round, reactive to light and accomodation.   HENT:Head atraumatic and normocephalic   Neck: No JVD or thyromegaly or lymphadenopathy   Lungs: CTAB, non labored breathing, no rales or wheezing.    Cardiovascular: regular rate and rhythm, S1, S2 normal, no murmur,   Abdomen:  Mild epigastric pain but however  no guarding or rigidity or rebound bowel sounds are present.   Extremities: extremities normal, atraumatic, no cyanosis or edema   Skin: Skin warm and dry   Neurologic: Grossly normal   Psychiatric: Normal affect, behavior,         I/O:  I/O last 24 hours:      Intake/Output Summary (Last 24 hours) at 07/26/16 1536  Last data filed at 07/26/16 0122   Gross per 24 hour   Intake             2485 ml   Output                0 ml   Net             2485 ml     I/O current shift:         Labs  Please indicate ordered or reviewed)  Reviewed: I have reviewed all lab results.        Radiology Tests (Please indicate ordered or reviewed)  Reviewed: MRI:   IMPRESSION:  IMPRESSION:  1. Changes consistent with moderate to severe acute pancreatitis.  2. No evidence of gallstones or common bile duct stones.      Problem List:  Active Hospital Problems   (*Primary Problem)    Diagnosis    Hypokalemia    Thrombocytopenia Unspecified  Acute pancreatitis       Assessment/ Plan:     Acute Pancreatitis:   possibly secondary to alcohol.    Continue to Keep the patient NPO   MR CP no gallstones are no common bile duct stone  MRI No evidence for pancreatic necrosis. Fatty infiltration liver. No evidence  for liver cirrhosis.  Keep NPO for now in view of the ongoing abdominal pain.  Liver function test improving      --AKI:  Secondary to dehydration.    Improved with IV fluids.    --Dehydration:  Secondary to intractable nausea and vomiting from acute pancreatitis.  Will give IV fluids and monitor patient closely.  Supportive care as needed.  Will give IV antiemetics    --Alcohol abuse:  Patient wishes for cessation.  Supportive care is necessary at this time.  Will give IV Ativan for alcohol withdrawal symptoms per the CIWA protocol.  Banana bag daily x3.  Recommend obtaining resources for outpatient assistance with cessation prior to the patient's discharge.  Initiate Librium    --Subutex use:  History of Heroin use in remission  for 2 years.  Patient takes Subutex.  We admitted agreement that the patient is willing to take IV opiates until the acute pancreatitis has improved and patient is able to return to oral medications at which time she will resume her Subutex.  [facility]  [phone], she takes subutex 8 mg bid   Resume   Telemetry    Transaminitis which seems to be improving.    MRCP noted. No gallstones no evidence of common bile duct stones.  This possibly is related to alcohol.    Discussed plan of care with the patient and the patient's fiancee at the bedside with the permission of the patient.  DVT prophylaxis:  SCD  GI prophylaxis: Protonix.  Code status:  Full code.

## 2016-07-26 NOTE — Care Plan (Signed)
Problem: Patient Care Overview (Adult,OB)  Goal: Plan of Care Review(Adult,OB)  The patient and/or their representative will communicate an understanding of their plan of care   Outcome: Ongoing (see interventions/notes)    Problem: Depression (Adult,Obstetrics,Pediatric)  Goal: Improved/Stable Mood  Patient will demonstrate the desired outcomes by discharge/transition of care.   Outcome: Ongoing (see interventions/notes)    Problem: Fall Risk (Adult)  Goal: Absence of Falls  Patient will demonstrate the desired outcomes by discharge/transition of care.   Outcome: Ongoing (see interventions/notes)    Problem: Pain, Acute (Adult)  Goal: Acceptable Pain Control/Comfort Level  Patient will demonstrate the desired outcomes by discharge/transition of care.   Outcome: Ongoing (see interventions/notes)    Problem: Alcohol Withdrawal Acute, Risk/Actual (Adult)  Prevent and manage potential problems including: 1. effects of AWS (alcohol withdrawal syndrome) including DTs (delirium tremens) 2. situational response   Goal: Signs and Symptoms of Listed Potential Problems Will be Absent, Minimized or Managed (Alcohol Withdrawal Acute, Risk/Actual)  Signs and symptoms of listed potential problems will be absent, minimized or managed by discharge/transition of care (reference Alcohol Withdrawal Acute, Risk/Actual (Adult) CPG).   Outcome: Ongoing (see interventions/notes)    Comments:   Care Plan Note     Medications and plan of care discussed with patient who verbalized understanding. Free from falls during this shift, significant other at bedside for most of the shift, attentive and helpful to patient. Medicated for pain to abdomen eith PRN ordered Dilaudid, administrations documented on MAR. Alcohol withdrawal monitored using CIWA-AR assessment score 14.     Levonne HubertLindsey Keerstin Bjelland, RN

## 2016-07-26 NOTE — Care Plan (Signed)
Problem: Alcohol Withdrawal Acute, Risk/Actual (Adult)  Prevent and manage potential problems including: 1. effects of AWS (alcohol withdrawal syndrome) including DTs (delirium tremens) 2. situational response   Goal: Signs and Symptoms of Listed Potential Problems Will be Absent, Minimized or Managed (Alcohol Withdrawal Acute, Risk/Actual)  Signs and symptoms of listed potential problems will be absent, minimized or managed by discharge/transition of care (reference Alcohol Withdrawal Acute, Risk/Actual (Adult) CPG).   Outcome: Ongoing (see interventions/notes)    07/26/16 1557   Alcohol Withdrawal Acute, Risk/Actual   Problems Assessed (Alcohol Withdrawal Syndrome) all   Problems Present (Alcohol W/D Syndrome) effects of AWS (alcohol withdrawal syndrome) including DTs (delirium tremens)         Comments:   Pt alert and oriented, CIWA being monitored and medicated appropriately.  Pt recently started on librium, education provided.

## 2016-07-26 NOTE — Nurses Notes (Signed)
Page to Dr. Darnelle Bosummala through the operator at this time.

## 2016-07-26 NOTE — Nurses Notes (Signed)
Second page placed to Dr. Raina Minaummalla as first page was not returned.

## 2016-07-26 NOTE — Care Plan (Signed)
Problem: Patient Care Overview (Adult,OB)  Goal: Plan of Care Review(Adult,OB)  The patient and/or their representative will communicate an understanding of their plan of care   Outcome: Ongoing (see interventions/notes)  [name]'s chart was reviewed for discharge needs.  She has been seen by Psych SW and given out patient resources for her ETOH issues.  She does not have insurance and [facility]  I will await consults.

## 2016-07-26 NOTE — Ancillary Notes (Signed)
Note from Dietitian Oswald HillockBrooke Jeovany Huitron, RDLD 10:17 07/26/2016    Consult received to determine appropriate TPN  RD following patient.  Recommend Clinimix 4.26/10 at a goal rate of 70 ml/hr with 30 gm IL to provide 71 grams protein and 1157 kcal.  RD will continue to monitor intakes/labs and adjust recommendations as needed.  F/U in 1-2 days.

## 2016-07-26 NOTE — Nurses Notes (Signed)
Assumed care of patient, Bedside shift report given by Leodis SiasLisa Burns, RN.

## 2016-07-26 NOTE — Pharmacy (Signed)
TPN Therapy Initiated  07/26/2016       Vogt,Allure  Date of Birth:  11/07/1990    Pharmacy received TPN consult on 07/26/2016.    Started: (Clinimix E) amino acids 4.25% infusion in D5W w/electrolytes  Lipids: yes  Central Line: no  Rate: 42 mL/hr    Pharmacy will continue to follow Tiani Kincy's TPN therapy.  Please contact the pharmacy with any questions.    Wilmer Floorammy Shakeitha Umbaugh, [facility] Campus[facility]PH  07/26/2016 12:36

## 2016-07-26 NOTE — Care Management Notes (Signed)
07/26/16 1152   Assessment Detail   Assessment Type Admission   Date of Care Management Update 07/26/16   Readmission   Is this a readmission? No   Social Work Equ[employer] traderlan   Discharge Planning Status initial meeting   Anticipated Discharge Disposition Home   Discharge Needs Assessment   Concerns To Be Addressed coping/stress concerns;financial/insurance concerns;medication concerns;substance/tobacco abuse/use concerns   Equipment Currently Used at Home none   Equipment Needed After Discharge none   [name]'s chart was reviewed for discharge needs.  She has been seen by Psych SW and given out patient resources for her ETOH issues.  She does not have insurance and [facility]  I will await consults.

## 2016-07-26 NOTE — Nurses Notes (Signed)
07/26/16 0746   SBAR Handoff Report   Handoff report given to: Toniann FailWendy, RN   Time SBAR given: [phone]

## 2016-07-26 NOTE — Progress Notes (Signed)
[facility]  [facility] Consult  Follow Up Note      [name],[name], 25 y.o. female  Date of Service: 07/26/2016  Date of Birth:  01/24/1991    Hospital Day:  LOS: 3 days     Chief Complaint:  Pancreatitis/etoh hepatitis/Fatty Liver  Subjective: Still with abdominal pain,and vomited this am as well.    Objective:  Temperature: 36.7 C (98.1 F)  Heart Rate: 74  BP (Non-Invasive): (!) 134/95  Respiratory Rate: 18  SpO2-1: 96 %  Pain Score (Numeric, Faces): 9  Constitutional:  mild distress  Respiratory:  Clear to auscultation bilaterally. , anteriorly  Cardiovascular:  regular rate and rhythm  Gastrointestinal:  soft,tenderness upper abdomen,no rebound or guarding  Neurologic:  Grossly normal    Labs:  I have reviewed all lab results.    Imaging Studies:  N/A    Assessment/Recommendations:  Suggest tpn be started as pt's pain not improved yet.  [name] Chessimothy Delbra Zellars, MD

## 2016-07-26 NOTE — Progress Notes (Signed)
Was contacted by patients primary nurse reporting that patient was taken off of her IV pain medications for her acute pancreatitis. The patient continues to have pain 9/10 with nausea and has been placed on TPN for nutrition. She has a history of heroin abuse and has been in remission for 2 years. Her Lipase is still elevated. The patient took Subutex 16mg  sublingual prior to coming into the hospital. Upon my admission we agreed that when her pain from the acute pancreatitis improved, she would be placed back on her subutex. She does have a history of alcohol abuse and is pursuing cessation and has a plan for getting clean and staying clean. I saw her again tonight in regards to the patient continuing to complain of acute pain to the right upper quadrant. I do believe her dosage of medication should be gradually decreased. I will hold Subutex for now and place on IV dilaudid, but at a lower dosage. This should be continued to be weaned and eventually be placed back on Subutex 16mg  BID. Patient is aware that she will not be discharged with narcotic pain medications. She want to go back on her prior Subutex and is understanding that she will not go home with pain medications.

## 2016-07-27 ENCOUNTER — Inpatient Hospital Stay (HOSPITAL_BASED_OUTPATIENT_CLINIC_OR_DEPARTMENT_OTHER)
Admission: EM | Admit: 2016-07-27 | Discharge: 2016-07-31 | DRG: 440 | Disposition: A | Discharge status: Home / Self Care | Payer: Self-pay | Attending: Internal Medicine | Admitting: Internal Medicine

## 2016-07-27 DIAGNOSIS — F1010 Alcohol abuse, uncomplicated: Secondary | ICD-10-CM | POA: Diagnosis present

## 2016-07-27 DIAGNOSIS — D6949 Other primary thrombocytopenia: Secondary | ICD-10-CM | POA: Insufficient documentation

## 2016-07-27 DIAGNOSIS — T360X5A Adverse effect of penicillins, initial encounter: Secondary | ICD-10-CM | POA: Diagnosis not present

## 2016-07-27 DIAGNOSIS — Z79899 Other long term (current) drug therapy: Secondary | ICD-10-CM

## 2016-07-27 DIAGNOSIS — G8929 Other chronic pain: Secondary | ICD-10-CM | POA: Diagnosis present

## 2016-07-27 DIAGNOSIS — Z880 Allergy status to penicillin: Secondary | ICD-10-CM

## 2016-07-27 DIAGNOSIS — L271 Localized skin eruption due to drugs and medicaments taken internally: Secondary | ICD-10-CM | POA: Diagnosis not present

## 2016-07-27 DIAGNOSIS — F1110 Opioid abuse, uncomplicated: Secondary | ICD-10-CM | POA: Diagnosis present

## 2016-07-27 DIAGNOSIS — E876 Hypokalemia: Secondary | ICD-10-CM | POA: Diagnosis present

## 2016-07-27 DIAGNOSIS — F17210 Nicotine dependence, cigarettes, uncomplicated: Secondary | ICD-10-CM | POA: Diagnosis present

## 2016-07-27 DIAGNOSIS — D6959 Other secondary thrombocytopenia: Secondary | ICD-10-CM | POA: Diagnosis present

## 2016-07-27 DIAGNOSIS — B1920 Unspecified viral hepatitis C without hepatic coma: Secondary | ICD-10-CM | POA: Diagnosis present

## 2016-07-27 DIAGNOSIS — K852 Alcohol induced acute pancreatitis without necrosis or infection: Principal | ICD-10-CM | POA: Diagnosis present

## 2016-07-27 DIAGNOSIS — K700 Alcoholic fatty liver: Secondary | ICD-10-CM | POA: Diagnosis present

## 2016-07-27 LAB — AMYLASE: AMYLASE: 308 U/L — ABNORMAL HIGH (ref 5–100)

## 2016-07-27 LAB — CBC WITH DIFF
BASOPHIL #: 0 x10ˆ3/uL (ref 0.00–0.10)
BASOPHIL #: 0 x10ˆ3/uL (ref 0.00–0.10)
BASOPHIL %: 1 % (ref 0–3)
BASOPHIL %: 0 % (ref 0–3)
EOSINOPHIL #: 0.1 x10ˆ3/uL (ref 0.00–0.50)
EOSINOPHIL #: 0.1 x10ˆ3/uL (ref 0.00–0.50)
EOSINOPHIL %: 3 % (ref 0–5)
EOSINOPHIL %: 1 % (ref 0–5)
HCT: 32.5 % — ABNORMAL LOW (ref 36.0–45.0)
HCT: 38.3 % (ref 36.0–45.0)
HGB: 11 g/dL — ABNORMAL LOW (ref 12.0–15.5)
HGB: 12.6 g/dL (ref 12.0–15.5)
LYMPHOCYTE #: 1.4 x10ˆ3/uL (ref 1.00–4.80)
LYMPHOCYTE #: 1.2 x10ˆ3/uL (ref 1.00–4.80)
LYMPHOCYTE %: 30 % (ref 15–43)
LYMPHOCYTE %: 24 % (ref 15–43)
MCH: 32.4 pg (ref 27.5–33.2)
MCH: 31.8 pg (ref 27.5–33.2)
MCHC: 33.8 g/dL (ref 32.0–36.0)
MCHC: 33.8 g/dL (ref 32.0–36.0)
MCHC: 32.9 g/dL (ref 32.0–36.0)
MCV: 95.8 fL (ref 82.0–97.0)
MCV: 96.4 fL (ref 82.0–97.0)
MONOCYTE #: 0.5 x10ˆ3/uL (ref 0.20–0.90)
MONOCYTE #: 0.4 10*3/uL (ref 0.20–0.90)
MONOCYTE #: 0.4 x10ˆ3/uL (ref 0.20–0.90)
MONOCYTE %: 10 % (ref 5–12)
MONOCYTE %: 8 % (ref 5–12)
MONOCYTE %: 8 % (ref 5–12)
MPV: 10.2 fL (ref 7.4–10.5)
MPV: 10 fL (ref 7.4–10.5)
NEUTROPHIL #: 2.7 x10ˆ3/uL (ref 1.50–6.50)
NEUTROPHIL #: 3.3 x10ˆ3/uL (ref 1.50–6.50)
NEUTROPHIL %: 57 % (ref 43–76)
NEUTROPHIL %: 57 % (ref 43–76)
NEUTROPHIL %: 67 % (ref 43–76)
PLATELETS: 88 x10ˆ3/uL — ABNORMAL LOW (ref 150–450)
PLATELETS: 111 x10ˆ3/uL — ABNORMAL LOW (ref 150–450)
RBC: 3.4 x10ˆ6/uL — ABNORMAL LOW (ref 4.00–5.10)
RBC: 3.97 x10ˆ6/uL — ABNORMAL LOW (ref 4.00–5.10)
RDW: 14.3 % (ref 11.0–16.0)
RDW: 14.4 % (ref 11.0–16.0)
WBC: 4.8 x10ˆ3/uL (ref 4.0–11.0)
WBC: 4.9 x10ˆ3/uL (ref 4.0–11.0)

## 2016-07-27 LAB — RENAL FUNCTION PANEL
ALBUMIN: 2.5 g/dL — ABNORMAL LOW (ref 3.5–5.0)
ANION GAP: 7 mmol/L (ref 3–11)
ANION GAP: 5 mmol/L (ref 3–11)
BUN/CREA RATIO: 11 (ref 6–22)
BUN/CREA RATIO: 13 (ref 6–22)
BUN: 7 mg/dL (ref 6–20)
BUN: 7 mg/dL (ref 6–20)
CALCIUM: 9 mg/dL (ref 8.6–10.3)
CALCIUM: 8.3 mg/dL — ABNORMAL LOW (ref 8.6–10.3)
CHLORIDE: 108 mmol/L (ref 101–111)
CHLORIDE: 109 mmol/L (ref 101–111)
CO2 TOTAL: 26 mmol/L (ref 22–32)
CO2 TOTAL: 24 mmol/L (ref 22–32)
CREATININE: 0.53 mg/dL (ref 0.44–1.00)
ESTIMATED GFR: >60 mL/min/1.73mˆ2 (ref >60)
ESTIMATED GFR: >60 mL/min/1.73mˆ2 (ref >60)
GLUCOSE: 90 mg/dL (ref 70–110)
GLUCOSE: 90 mg/dL (ref 70–110)
GLUCOSE: 100 mg/dL (ref 70–110)
PHOSPHORUS: 5.1 mg/dL — ABNORMAL HIGH (ref 2.7–4.5)
PHOSPHORUS: 4.7 mg/dL — ABNORMAL HIGH (ref 2.7–4.5)
POTASSIUM: 3.2 mmol/L — ABNORMAL LOW (ref 3.4–5.1)
SODIUM: 141 mmol/L (ref 136–145)
SODIUM: 138 mmol/L (ref 136–145)

## 2016-07-27 LAB — COMPREHENSIVE METABOLIC PROFILE - BMC/JMC ONLY
ALBUMIN/GLOBULIN RATIO: 1.3 (ref 0.8–2.0)
ALBUMIN/GLOBULIN RATIO: 1.3 (ref 0.8–2.0)
ALBUMIN/GLOBULIN RATIO: 1.3 (ref 0.8–2.0)
ALBUMIN: 2.9 g/dL — ABNORMAL LOW (ref 3.5–5.0)
ALBUMIN: 3.5 g/dL (ref 3.5–5.0)
ALKALINE PHOSPHATASE: 78 U/L (ref 38–126)
ALKALINE PHOSPHATASE: 93 U/L (ref 38–126)
ALT (SGPT): 21 U/L (ref 14–54)
ALT (SGPT): 24 U/L (ref 14–54)
ANION GAP: 7 mmol/L (ref 3–11)
ANION GAP: 9 mmol/L (ref 3–11)
AST (SGOT): 41 U/L (ref 15–41)
AST (SGOT): 40 U/L (ref 15–41)
BILIRUBIN TOTAL: 1 mg/dL (ref 0.3–1.2)
BILIRUBIN TOTAL: 1.2 mg/dL (ref 0.3–1.2)
BUN/CREA RATIO: 11 (ref 6–22)
BUN/CREA RATIO: 9 (ref 6–22)
BUN: 7 mg/dL (ref 6–20)
BUN: 6 mg/dL (ref 6–20)
CALCIUM: 9 mg/dL (ref 8.6–10.3)
CALCIUM: 9 mg/dL (ref 8.6–10.3)
CHLORIDE: 108 mmol/L (ref 101–111)
CHLORIDE: 104 mmol/L (ref 101–111)
CO2 TOTAL: 26 mmol/L (ref 22–32)
CO2 TOTAL: 28 mmol/L (ref 22–32)
CREATININE: 0.65 mg/dL (ref 0.44–1.00)
CREATININE: 0.69 mg/dL (ref 0.44–1.00)
ESTIMATED GFR: >60 mL/min/1.73mˆ2 (ref >60)
ESTIMATED GFR: >60 mL/min/1.73mˆ2 (ref >60)
GLUCOSE: 90 mg/dL (ref 70–110)
GLUCOSE: 116 mg/dL — ABNORMAL HIGH (ref 70–110)
POTASSIUM: 3.5 mmol/L (ref 3.4–5.1)
POTASSIUM: 3.3 mmol/L — ABNORMAL LOW (ref 3.4–5.1)
PROTEIN TOTAL: 5.1 g/dL — ABNORMAL LOW (ref 6.4–8.3)
PROTEIN TOTAL: 6.3 g/dL — ABNORMAL LOW (ref 6.4–8.3)
SODIUM: 141 mmol/L (ref 136–145)
SODIUM: 141 mmol/L (ref 136–145)

## 2016-07-27 LAB — URINALYSIS WITH MICROSCOPIC REFLEX IF INDICATED BMC/JMC ONLY
BILIRUBIN: NEGATIVE mg/dL
BLOOD: NEGATIVE mg/dL
GLUCOSE: NEGATIVE mg/dL
KETONES: NEGATIVE mg/dL
LEUKOCYTES: NEGATIVE WBCs/uL
NITRITE: NEGATIVE
PH: 7 (ref <8.0)
PH: 7 (ref <8.0)
PROTEIN: NEGATIVE mg/dL
SPECIFIC GRAVITY: 1.01 (ref <1.022)
SPECIFIC GRAVITY: 1.01 (ref <1.022)
UROBILINOGEN: 2 mg/dL (ref <=2.0)

## 2016-07-27 LAB — PREALBUMIN
PREALBUMIN: 20.2 mg/dL (ref 18.0–38.0)
PREALBUMIN: 20.2 mg/dL (ref 18.0–38.0)

## 2016-07-27 LAB — ETHANOL, SERUM
ETHANOL: <10 mg/dL (ref <10)
ETHANOL: NOT DETECTED

## 2016-07-27 LAB — MAGNESIUM
MAGNESIUM: 1.7 mg/dL (ref 1.4–2.1)
MAGNESIUM: 1.7 mg/dL (ref 1.4–2.1)
MAGNESIUM: 1.5 mg/dL (ref 1.4–2.1)

## 2016-07-27 LAB — POC FINGERSTICK GLUCOSE - BMC/JMC (RESULTS): GLUCOSE, POC: 98 mg/dL (ref 60–100)

## 2016-07-27 LAB — PHOSPHORUS: PHOSPHORUS: 5 mg/dL — ABNORMAL HIGH (ref 2.7–4.5)

## 2016-07-27 LAB — TRIGLYCERIDE: TRIGLYCERIDES: 76 mg/dL (ref 35–135)

## 2016-07-27 LAB — LIPASE
LIPASE: 706 U/L — ABNORMAL HIGH (ref 22–51)
LIPASE: 689 U/L — ABNORMAL HIGH (ref 22–51)

## 2016-07-27 MED ORDER — NITROGLYCERIN 0.4 MG SUBLINGUAL TABLET
0.4000 mg | SUBLINGUAL_TABLET | SUBLINGUAL | Status: DC | PRN
Start: 2016-07-27 — End: 2016-07-31

## 2016-07-27 MED ORDER — LACTATED RINGERS INTRAVENOUS SOLUTION
INTRAVENOUS | Status: DC
Start: 2016-07-27 — End: 2016-07-30

## 2016-07-27 MED ORDER — PANTOPRAZOLE 40 MG INTRAVENOUS SOLUTION
40.0000 mg | Freq: Every day | INTRAVENOUS | Status: DC
Start: 2016-07-27 — End: 2016-07-29
  Administered 2016-07-27 – 2016-07-29 (×2): 40 mg via INTRAVENOUS
  Filled 2016-07-27 (×2): qty 10

## 2016-07-27 MED ORDER — ACETAMINOPHEN 325 MG TABLET
650.0000 mg | ORAL_TABLET | Freq: Four times a day (QID) | ORAL | Status: DC | PRN
Start: 2016-07-27 — End: 2016-07-28

## 2016-07-27 MED ORDER — SODIUM CHLORIDE 0.9 % IV BOLUS
2000.00 mL | INJECTION | Status: AC
Start: 2016-07-27 — End: 2016-07-27
  Administered 2016-07-27: 2000 mL via INTRAVENOUS

## 2016-07-27 MED ORDER — ENOXAPARIN 40 MG/0.4 ML SUB-Q SYRINGE - EAST
40.0000 mg | INJECTION | Freq: Every day | SUBCUTANEOUS | Status: DC
Start: 2016-07-28 — End: 2016-07-31
  Administered 2016-07-28 – 2016-07-31 (×4): 40 mg via SUBCUTANEOUS
  Filled 2016-07-27: qty 0.4
  Filled 2016-07-27 (×2): qty 0
  Filled 2016-07-27: qty 0.4

## 2016-07-27 MED ORDER — SODIUM CHLORIDE 0.9 % (FLUSH) INJECTION SYRINGE
10.0000 mL | INJECTION | Freq: Three times a day (TID) | INTRAMUSCULAR | Status: DC
Start: 2016-07-27 — End: 2016-07-27

## 2016-07-27 MED ORDER — SODIUM CHLORIDE 0.9 % (FLUSH) INJECTION SYRINGE
10.00 mL | INJECTION | Freq: Three times a day (TID) | INTRAMUSCULAR | Status: DC
Start: 2016-07-27 — End: 2016-07-27

## 2016-07-27 MED ORDER — TRACE ELEMENTS CR-CU-MN-SE-ZN 10 MCG-1 MG-0.5 MG-60 MCG-5MG/ML IV SOLN
INTRAVENOUS | Status: DC
Start: 2016-07-27 — End: 2016-07-27
  Filled 2016-07-27: qty 2000

## 2016-07-27 MED ORDER — POTASSIUM CHLORIDE 10 MEQ/100ML IN STERILE WATER INTRAVENOUS PIGGYBACK
10.0000 meq | INJECTION | INTRAVENOUS | Status: AC
Start: 2016-07-28 — End: 2016-07-28
  Administered 2016-07-28 (×2): 10 meq via INTRAVENOUS
  Filled 2016-07-27 (×2): qty 100

## 2016-07-27 MED ORDER — NICOTINE 21 MG/24 HR DAILY TRANSDERMAL PATCH
21.0000 mg | MEDICATED_PATCH | Freq: Every day | TRANSDERMAL | Status: DC
Start: 2016-07-28 — End: 2016-07-31
  Administered 2016-07-28 – 2016-07-31 (×7): 21 mg via TRANSDERMAL
  Filled 2016-07-27 (×4): qty 1

## 2016-07-27 MED ORDER — PHARMACY TPN CONSULT
Freq: Every day | Status: DC | PRN
Start: 2016-07-27 — End: 2016-07-31
  Filled 2016-07-27: qty 1

## 2016-07-27 MED ORDER — SODIUM CHLORIDE 0.9 % (FLUSH) INJECTION SYRINGE
10.00 mL | INJECTION | INTRAMUSCULAR | Status: DC | PRN
Start: 2016-07-27 — End: 2016-07-27

## 2016-07-27 MED ORDER — CHLORDIAZEPOXIDE 25 MG CAPSULE
25.00 mg | ORAL_CAPSULE | Freq: Three times a day (TID) | ORAL | Status: DC
Start: 2016-07-27 — End: 2016-07-27

## 2016-07-27 MED ORDER — FAT EMULSION 20 % IV (SCHEDULED, NO OVERFILL)
20.0000 g | Freq: Once | INTRAVENOUS | Status: DC
Start: 2016-07-28 — End: 2016-07-27
  Filled 2016-07-27: qty 250

## 2016-07-27 MED ORDER — SODIUM CHLORIDE 0.9 % (FLUSH) INJECTION SYRINGE
10.0000 mL | INJECTION | Freq: Three times a day (TID) | INTRAMUSCULAR | Status: DC
Start: 2016-07-27 — End: 2016-07-31
  Administered 2016-07-27: 10 mL via INTRAVENOUS
  Administered 2016-07-28: 0 mL via INTRAVENOUS
  Administered 2016-07-28: 10 mL via INTRAVENOUS
  Administered 2016-07-28 – 2016-07-29 (×2): 0 mL via INTRAVENOUS
  Administered 2016-07-29 – 2016-07-30 (×5): 10 mL via INTRAVENOUS
  Administered 2016-07-31: 0 mL via INTRAVENOUS

## 2016-07-27 MED ORDER — LORAZEPAM 2 MG/ML INJECTION SOLUTION
1.00 mg | Freq: Four times a day (QID) | INTRAMUSCULAR | Status: DC | PRN
Start: 2016-07-27 — End: 2016-07-31
  Administered 2016-07-27 – 2016-07-28 (×3): 1 mg via INTRAVENOUS
  Filled 2016-07-27 (×7): qty 1

## 2016-07-27 MED ORDER — ONDANSETRON HCL (PF) 4 MG/2 ML INJECTION SOLUTION
4.0000 mg | INTRAMUSCULAR | Status: DC | PRN
Start: 2016-07-27 — End: 2016-07-31
  Administered 2016-07-28 – 2016-07-31 (×4): 4 mg via INTRAVENOUS
  Filled 2016-07-27 (×4): qty 2

## 2016-07-27 MED ORDER — HYDROMORPHONE 2 MG/ML INJECTION SYRINGE
1.00 mg | INJECTION | INTRAMUSCULAR | Status: DC | PRN
Start: 2016-07-27 — End: 2016-07-29
  Administered 2016-07-27 – 2016-07-29 (×10): 1 mg via INTRAVENOUS
  Filled 2016-07-27 (×10): qty 1

## 2016-07-27 MED ORDER — SODIUM CHLORIDE 0.9 % (FLUSH) INJECTION SYRINGE
10.0000 mL | INJECTION | INTRAMUSCULAR | Status: DC | PRN
Start: 2016-07-27 — End: 2016-07-31

## 2016-07-27 MED ORDER — LORAZEPAM 2 MG/ML INJECTION SOLUTION
2.00 mg | Freq: Three times a day (TID) | INTRAMUSCULAR | Status: DC | PRN
Start: 2016-07-27 — End: 2016-07-27

## 2016-07-27 MED ADMIN — LORazepam 2 mg/mL injection solution: INTRAVENOUS | @ 01:00:00

## 2016-07-27 MED ADMIN — HYDROmorphone 2 mg/mL injection syringe: INTRAVENOUS | @ 05:00:00

## 2016-07-27 MED ADMIN — sodium chloride 0.9 % intravenous solution: INTRAVENOUS | @ 20:00:00

## 2016-07-27 NOTE — ED Nurses Note (Signed)
Report called to 5th

## 2016-07-27 NOTE — H&P (Signed)
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 The patient was admitted the other day for acute alcoholic pancreatitis. She was kept NPO as she continued having abdominal pain and her lipase was not improving. This morning she signed out AMA because she was tired of not being allowed to eat anything. She went home and tried to eat a piece of toast, but this caused significant increased abdominal pain and nausea, but no vomiting. The pain did not improve, so she came back to the ED. She denies fevers, chills, emesis, diarrhea, melena, CP, SOB, cough. The pain is a sharp 10/10 epigastric pain that radiates to her back and is worse with eating or sharp movements. In the ED her lipase was 690 and her remaining labs were relatively unremarkable. She was admitted for further care.     Patient Active Problem List    Diagnosis Date Noted    Acute alcoholic pancreatitis 07/27/2016    Hypokalemia 07/26/2016    Thrombocytopenia Unspecified 07/24/2016    Acute pancreatitis 07/23/2016       Past Medical History:   Diagnosis Date    ETOH abuse     Hepatitis C     Heroin abuse            Past Surgical History:   Procedure Laterality Date    HX TONSILLECTOMY             Medications Prior to Admission     Prescriptions    buprenorphine HCl (SUBUTEX) 8 mg Sublingual Tablet, Sublingual    8 mg by Sublingual route Twice daily          Current Facility-Administered Medications:  acetaminophen (TYLENOL) tablet 650 mg Oral Q6H PRN   [START ON 07/28/2016] enoxaparin (LOVENOX) 40 mg/0.4 mL SubQ injection 40 mg Subcutaneous Daily   HYDROmorphone (DILAUDID) 2 mg/mL injection 1  mg Intravenous Q3H PRN   LORazepam (ATIVAN) 2 mg/mL injection 1 mg Intravenous Q6H PRN   LR premix infusion  Intravenous Continuous   nitroGLYCERIN (NITROSTAT) sublingual tablet 0.4 mg Sublingual Q5 Min PRN   NS flush syringe 10 mL Intravenous Q8HRS   NS flush syringe 10 mL Intravenous Q1H PRN   ondansetron (ZOFRAN) 2 mg/mL injection 4 mg Intravenous Q4H PRN       No Known Allergies    Social History   Substance Use Topics    Smoking status: Current Every Day Smoker     Packs/day: 1.00     Types: Cigarettes    Smokeless tobacco: Not on file    Alcohol use Yes      Comment: daily 2 large bottles of wine       FH: HTN      ROS: Other than ROS in the HPI, all other systems were negative.    EXAM:  Temperature: 36.5 C (97.7 F)  Heart Rate: 42  BP (Non-Invasive): (!) 149/81  Respiratory Rate: 20  SpO2-1: 100 %  Pain Score (Numeric, Faces): 9  General: appears in pain, fatigued.   Eyes: Pupils equal and round, reactive to light. Anicteric  HEENT: Head atraumatic and normocephalic, dry MM  Neck: No JVD or thyromegaly or lymphadenopathy   Lungs: No respiratory distress. Lungs CTAB. No w/r/r.   Cardiovascular: normal rate, regular rhythm, S1, S2 normal, no murmur appreciated  Abdomen: Soft, non-distended, hyperactive bowel sounds, TTP in epigastrium with voluntary guarding, no rebound, no masses noted but she tenses up when examining the epigastric region.  Extremities: extremities normal, atraumatic, no cyanosis or edema. DP pulses 2+ and equal bilaterally.   Skin: Skin warm and dry   Neurologic: Alert, oriented, no focal deficit, CN II-XII grossly intact  Lymphatics: No lymphadenopathy   Psychiatric: Normal affect, behavior       Labs:    I have reviewed all lab results.  Lab Results for Last 24 Hours:  Results for orders placed or performed during the hospital encounter of 07/27/16 (from the past 24 hour(s))   COMPREHENSIVE METABOLIC PROFILE - [facility] ONLY   Result Value Ref Range    SODIUM 141 136 - 145 mmol/L     POTASSIUM 3.3 (L) 3.4 - 5.1 mmol/L    CHLORIDE 104 101 - 111 mmol/L    CO2 TOTAL 28 22 - 32 mmol/L    ANION GAP 9 3 - 11 mmol/L    BUN 6 6 - 20 mg/dL    CREATININE 1.61 0.96 - 1.00 mg/dL    BUN/CREA RATIO 9 6 - 22    ESTIMATED GFR >60 >60 mL/min/1.49m2    ALBUMIN 3.5 3.5 - 5.0 g/dL    CALCIUM 9.0 8.6 - 04.5 mg/dL    GLUCOSE 409 (H) 70 - 110 mg/dL    ALKALINE PHOSPHATASE 93 38 - 126 U/L    ALT (SGPT) 24 14 - 54 U/L    AST (SGOT) 40 15 - 41 U/L    BILIRUBIN TOTAL 1.2 0.3 - 1.2 mg/dL    PROTEIN TOTAL 6.3 (L) 6.4 - 8.3 g/dL    ALBUMIN/GLOBULIN RATIO 1.3 0.8 - 2.0   URINALYSIS WITH MICROSCOPIC REFLEX IF INDICATED [facility] ONLY   Result Value Ref Range    COLOR Light Yellow Light Yellow, Straw, Yellow    APPEARANCE Clear Clear    PH 7.0 <8.0    LEUKOCYTES Negative Negative WBCs/uL    NITRITE Negative Negative    PROTEIN Negative Negative mg/dL    GLUCOSE Negative Negative mg/dL    KETONES Negative Negative mg/dL    UROBILINOGEN 2.0  <=8.1 mg/dL    BILIRUBIN Negative Negative mg/dL    BLOOD Negative Negative mg/dL    SPECIFIC GRAVITY 1.914 <1.022   CBC WITH DIFF   Result Value Ref Range    WBC 4.9 4.0 - 11.0 x103/uL    RBC 3.97 (L) 4.00 - 5.10 x106/uL    HGB 12.6 12.0 - 15.5 g/dL    HCT 78.2 95.6 - 21.3 %    MCV 96.4 82.0 - 97.0 fL    MCH 31.8 27.5 - 33.2 pg    MCHC 32.9 32.0 - 36.0 g/dL    RDW 08.6 57.8 - 46.9 %    PLATELETS 111 (L) 150 - 450 x103/uL    MPV 10.0 7.4 - 10.5 fL    NEUTROPHIL % 67 43 - 76 %    LYMPHOCYTE % 24 15 - 43 %    MONOCYTE % 8 5 - 12 %    EOSINOPHIL % 1 0 - 5 %    BASOPHIL % 0 0 - 3 %    NEUTROPHIL # 3.30 1.50 - 6.50 x103/uL  LYMPHOCYTE # 1.20 1.00 - 4.80 x103/uL    MONOCYTE # 0.40 0.20 - 0.90 x103/uL    EOSINOPHIL # 0.10 0.00 - 0.50 x103/uL    BASOPHIL # 0.00 0.00 - 0.10 x103/uL   Lipase   Result Value Ref Range    LIPASE 689 (H) 22 - 51 U/L   MAGNESIUM   Result Value Ref Range    MAGNESIUM 1.7 1.4 - 2.1 mg/dL   PHOSPHORUS   Result Value Ref Range    PHOSPHORUS 5.0 (H) 2.7 - 4.5 mg/dL      ETHANOL, SERUM   Result Value Ref Range    ETHANOL <10 <10 mg/dL    ETHANOL Not Detected    Results for orders placed or performed during the hospital encounter of 07/23/16 (from the past 24 hour(s))   POC FINGERSTICK GLUCOSE - [facility] (RESULTS)   Result Value Ref Range    GLUCOSE, POC 105 (H) 60 - 100 mg/dL   AMYLASE   Result Value Ref Range    AMYLASE 308 (H) 5 - 100 U/L   LIPASE   Result Value Ref Range    LIPASE 706 (H) 22 - 51 U/L   RENAL FUNCTION PANEL - TPN DAY 1   Result Value Ref Range    SODIUM 141 136 - 145 mmol/L    POTASSIUM 3.5 3.4 - 5.1 mmol/L    CHLORIDE 108 101 - 111 mmol/L    CO2 TOTAL 26 22 - 32 mmol/L    ANION GAP 7 3 - 11 mmol/L    BUN 7 6 - 20 mg/dL    CREATININE 1.61 0.96 - 1.00 mg/dL    BUN/CREA RATIO 11 6 - 22    ESTIMATED GFR >60 >60 mL/min/1.59m2    CALCIUM 9.0 8.6 - 10.3 mg/dL    GLUCOSE 90 70 - 045 mg/dL    PHOSPHORUS 5.1 (H) 2.7 - 4.5 mg/dL    ALBUMIN 2.9 (L) 3.5 - 5.0 g/dL   MAGNESIUM - TPN DAY 1   Result Value Ref Range    MAGNESIUM 1.7 1.4 - 2.1 mg/dL   TRIGLYCERIDE - TOMORROW AM - TPN   Result Value Ref Range    TRIGLYCERIDES 76 35 - 135 mg/dL   PREALBUMIN - EVERY MONDAY  FOR 2 WEEKS - TPN   Result Value Ref Range    PREALBUMIN 20.2 18.0 - 38.0 mg/dL   COMPREHENSIVE METABOLIC PROFILE - [facility] ONLY   Result Value Ref Range    SODIUM 141 136 - 145 mmol/L    POTASSIUM 3.5 3.4 - 5.1 mmol/L    CHLORIDE 108 101 - 111 mmol/L    CO2 TOTAL 26 22 - 32 mmol/L    ANION GAP 7 3 - 11 mmol/L    BUN 7 6 - 20 mg/dL    CREATININE 4.09 8.11 - 1.00 mg/dL    BUN/CREA RATIO 11 6 - 22    ESTIMATED GFR >60 >60 mL/min/1.64m2    ALBUMIN 2.9 (L) 3.5 - 5.0 g/dL    CALCIUM 9.0 8.6 - 91.4 mg/dL    GLUCOSE 90 70 - 782 mg/dL    ALKALINE PHOSPHATASE 78 38 - 126 U/L    ALT (SGPT) 21 14 - 54 U/L    AST (SGOT) 41 15 - 41 U/L    BILIRUBIN TOTAL 1.0 0.3 - 1.2 mg/dL    PROTEIN TOTAL 5.1 (L) 6.4 - 8.3 g/dL    ALBUMIN/GLOBULIN RATIO 1.3 0.8 - 2.0   CBC WITH DIFF  Result Value Ref Range    WBC 4.8 4.0 - 11.0 x103/uL     RBC 3.40 (L) 4.00 - 5.10 x106/uL    HGB 11.0 (L) 12.0 - 15.5 g/dL    HCT 16.1 (L) 09.6 - 45.0 %    MCV 95.8 82.0 - 97.0 fL    MCH 32.4 27.5 - 33.2 pg    MCHC 33.8 32.0 - 36.0 g/dL    RDW 04.5 40.9 - 81.1 %    PLATELETS 88 (L) 150 - 450 x103/uL    MPV 10.2 7.4 - 10.5 fL    NEUTROPHIL % 57 43 - 76 %    LYMPHOCYTE % 30 15 - 43 %    MONOCYTE % 10 5 - 12 %    EOSINOPHIL % 3 0 - 5 %    BASOPHIL % 1 0 - 3 %    NEUTROPHIL # 2.70 1.50 - 6.50 x103/uL    LYMPHOCYTE # 1.40 1.00 - 4.80 x103/uL    MONOCYTE # 0.50 0.20 - 0.90 x103/uL    EOSINOPHIL # 0.10 0.00 - 0.50 x103/uL    BASOPHIL # 0.00 0.00 - 0.10 x103/uL   POC FINGERSTICK GLUCOSE - [facility] (RESULTS)   Result Value Ref Range    GLUCOSE, POC 98 60 - 100 mg/dL       Imaging Studies:    None    Assessment/Plan:   [name] is a 25 y.o., White female who presents with the following:    1. Acute Alcoholic Pancreatitis:  - Still having too severe of symptoms to advance diet.   - Will continue NPO status and place on PPN.   - Will follow daily lipase level.  - Check CT Abdomen with contrast to evaluate for any pancreatic complication.   - IV Dilaudid PRN. IV Protonix while NPO. IV Zofran PRN for nausea.     2. Alcohol Abuse: denies drinking when she went home.  - Check alcohol level.  - Received banana bag previously, no need for that now.   - IV Ativan PRN for alcohol withdrawal. No need for scheduled Ativan currently.     3. Acute Hypokalemia:   - Will give IV potassium.  - Check magnesium level. Repeat CMP in the morning.     4. Thrombocytopenia:   - No acute issues, monitor CBC. Platelet count improving.     5. 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 Will need to resume this once she is off IV pain medications.     6. Tobacco Use Disorder: counseled on cessation. Nicoderm ordered.     DVT PPx: Lovenox  Code Status:  Full Code    Dorthy Cooler, MD       Portions of this note may be dictated using voice recognition software or a dictation service.  Variances in spelling and vocabulary are possible and unintentional. Not all errors are caught/corrected. Please notify the Thereasa Parkin if any discrepancies are noted or if the meaning of any statement is not clear.

## 2016-07-27 NOTE — Nurses Notes (Signed)
Patient refusing to have CT scan at this time.  Cancelled order, per patients request.  Will make primary RN aware.

## 2016-07-27 NOTE — ED Triage Notes (Signed)
[facility]. Returns w/ continued abd/back pain.

## 2016-07-27 NOTE — Nurses Notes (Signed)
Patient arrived to the floor at this time.

## 2016-07-27 NOTE — Nurses Notes (Signed)
Pt requesting to leave AMA, Pt explained the risks of leaving AMA, pt verbalized understanding.  Pt ambulated off the floor with personal belongings.  Attending physician made aware.

## 2016-07-27 NOTE — Pharmacy (Signed)
TPN Therapy Follow Up  07/27/2016       Frost,[name]  Date of Birth:  09/29/1991    TPN Base: (Clinimix E) amino acids 4.25% infusion in D5W w/electrolytes  Lipids: yes  Central Line: no  Rate: 62 mL/hr      TPN Order Summary      The values shown are based on the patient receiving 0.74 bags over 24 hours.           Range     Amino Acids (g) 62.9 --     Dextrose (g) 74 --     Dextrose Concentration (%) 4.96 0-10     Volume 1,491.84 --     Infusion Rate (mL/hr) 62      Infusion Site Peripheral      Multi-vitamins (mL) 7.4 --     Trace Elements (mL) 0.74 --     Weight Used 46.3 kg         Caloric Contribution          kcal/kg kcal %     Protein 5.43 251.6 50     Dextrose 5.43 251.6 50     Lipids -- -- --     Total 10.87 503.2                Electrolytes      Cations Amount Range     Sodium (mEq) 51.8 --     Potassium (mEq) 51.8 --     Calcium (mEq) 6.66 --     Magnesium (mEq) 7.4 --     Anions           Phosphate (mmol) 22.2 --     Chloride (mEq) 65.12 --     Acetate (mEq) 103.6 --        Mixture Compatibility            Range     Calcium Phosphate Solubility Curve (mEq/L of Calcium) 4.46 <=19.64     Osmolarity 827.38 0-900     Calcium: Phosphate Ratio (mEq: mmol) 0.3 --                       Comments: Increased to 2462ml/h. Consider decreasing LR rate.    Pharmacy will continue to follow [name] TPN therapy.  Please contact the pharmacy with any questions.    Wilmer Floorammy Janalyn Higby, [facility]PH  07/27/2016 08:20

## 2016-07-27 NOTE — ED Provider Notes (Signed)
[name] Upperman M, MD  [facility]  [name]    Date:  07/27/2016  Primary care provider:  Ruffin FrederickJoy Ramsay Farah, MD  Means of arrival:  private car  History obtained from: patient  History limited by: none    Chief Complaint:  Abdominal pain    HISTORY OF PRESENT ILLNESS     [name], date of birth 08/01/1991, is a 25 y.o. female who presents to the Emergency Department complaining of abdominal pain. Patient has pancreatitis. [facility] today because she was frustrated at not being able to eat. She went home, ate some buttered bread, and her pain got worse. Patient wants to be admitted to the hospital again.  Patient rates their pain a 10/10.  She denies vomiting, diarrhea, fever, or other symptoms.    REVIEW OF SYSTEMS     The pertinent positive and negative symptoms are as per HPI. All other systems reviewed and are negative.     PATIENT HISTORY     Past Medical History:  Past Medical History:   Diagnosis Date    ETOH abuse     Hepatitis C     Heroin abuse        Past Surgical History:  Past Surgical History:   Procedure Laterality Date    Hx tonsillectomy         Family History:  Family Medical History     None        Social History:  Social History   Substance Use Topics    Smoking status: Current Every Day Smoker     Packs/day: 1.00     Types: Cigarettes    Smokeless tobacco: Not on file    Alcohol use Yes      Comment: daily 2 large bottles of wine     History   Drug Use No     Comment: hx heroin       Medications:  Current Outpatient Prescriptions   Medication Sig    buprenorphine HCl (SUBUTEX) 8 mg Sublingual Tablet, Sublingual 8 mg by Sublingual route Twice daily       Allergies:  No Known Allergies    PHYSICAL EXAM     Vitals:  Filed Vitals:    07/27/16 1803   BP: (!) 148/90   Pulse: 45   Resp: 20   Temp: 36.7 C (98 F)   SpO2: 100%       Pulse ox  100% on None (Room Air) interpreted by me as:  Normal    Constitutional: no acute distress   Head: Normocephalic and atraumatic.   ENT: Moist mucous membranes. No erythema or exudates in the oropharynx.  Eyes: EOM are normal. Pupils are equal, round, and reactive to light. No scleral icterus.   Neck: Neck supple. No meningismus.  Cardiovascular: Normal rate and regular rhythm. No murmur heard. 2+ distal pulses all 4 extremities.  Pulmonary/Chest: Effort normal and breath sounds normal.   Abdominal: Soft. No distension. There is epigastric tenderness without guarding or rebound.   Back: There is no CVA tenderness.   Musculoskeletal: Normal range of motion. No edema and no tenderness. No clubbing or cyanosis.  Neurological: Patient is alert and oriented to person, place, and time. Strength and sensation normal in all extremities. Normal facial symmetry and speech.   Skin: Skin is warm and dry. No rash noted.     DIAGNOSTIC STUDIES  ED PROGRESS NOTE / MEDICAL DECISION MAKING     Old records reviewed by me:  I have reviewed the nurse's notes. I have reviewed the patient's problem list.      Orders Placed This Encounter    INSERT & MAINTAIN PERIPHERAL IV ACCESS    NS bolus infusion 2,000 mL    NS flush syringe    NS flush syringe       IV access ordered.    6:58 PM: Initial evaluation is complete at this time. I discussed with the patient that I would speak to the hospitalist to see about admission to further evaluate. Patient is agreeable with the treatment plan at this time.    7:14 PM: I discussed the patient's case and above findings at length with Dr. Jonette Pesa [facility] [place]) who will admit.    I have screened the patient for tobacco use and the patient is a tobacco user.  I have counseled the patient for less than 3 minutes to quit using tobacco products due to their multiple adverse health effects.      Pre-hypertension/Hypertension: The patient has been informed that they may have pre-hypertension or hypertension based on an elevated blood pressure  reading in the ED.  The admitting physician will follow up on this while the patient is in the hospital.     Pre-Disposition Vitals:  Filed Vitals:    07/27/16 1803   BP: (!) 148/90   Pulse: 45   Resp: 20   Temp: 36.7 C (98 F)   SpO2: 100%         CLINICAL IMPRESSION     1. Persistent pancreatitis     DISPOSITION/PLAN     Admitted      Condition at Disposition: Stable        SCRIBE ATTESTATION STATEMENT  I Justyce Knotts, SCRIBE scribed for Idelle Crouch, MD on 07/27/2016 at 6:46 PM.     Documentation assistance provided for Idelle Crouch, MD  by Idalia Needle, SCRIBE. Information recorded by the scribe was done at my direction and has been reviewed and validated by me Renee Rival, Araceli Bouche, MD.

## 2016-07-27 NOTE — Nurses Notes (Signed)
Assumed care of patient at this time

## 2016-07-27 NOTE — Care Plan (Signed)
Problem: Alcohol Withdrawal Acute, Risk/Actual (Adult)  Prevent and manage potential problems including: 1. effects of AWS (alcohol withdrawal syndrome) including DTs (delirium tremens) 2. situational response   Intervention: Support/Optimize Psychosocial Response to Withdrawal Process    07/27/16 1000   Coping/Psychosocial Interventions   Supportive Measures active listening utilized;counseling provided   Pain/Comfort/Sleep Interventions   Sleep/Rest Enhancement awakenings minimized;consistent schedule promoted;family presence promoted       Intervention: Minimize/Manage Withdrawal Symptoms    07/27/16 1000   Safety Interventions   Environmental Safety Modification room near unit station;mobility aid in reach   Cognitive Interventions   Sensory Stimulation Regulation care clustered         Goal: Signs and Symptoms of Listed Potential Problems Will be Absent, Minimized or Managed (Alcohol Withdrawal Acute, Risk/Actual)  Signs and symptoms of listed potential problems will be absent, minimized or managed by discharge/transition of care (reference Alcohol Withdrawal Acute, Risk/Actual (Adult) CPG).     07/27/16 1000   Alcohol Withdrawal Acute, Risk/Actual   Problems Assessed (Alcohol Withdrawal Syndrome) all   Problems Present (Alcohol W/D Syndrome) effects of AWS (alcohol withdrawal syndrome) including DTs (delirium tremens)         Comments:   Pt alert and oriented with complaints of intermittant nausea and feelings of itchiness.  Pt scored 11 on CIWA assessment, administered 2mg  IV ativan per order.  Will continue to monitor.

## 2016-07-27 NOTE — Nurses Notes (Signed)
Pt educated on the importance of not eating or drinking. Gatorade noted at bedside

## 2016-07-27 NOTE — Nurses Notes (Signed)
Pt called into room by patient requesting zofran and ativan.  Pt states she does not understand what her doctor was telling her, pt then called the doctor obscene names.  This Clinical research associatewriter explained plan of care for patient, changes that were made.  Pt becomes upset stating "Why is she taking my ativan away?" This writer explained that we are not taking it away, we are increasing librium and decreasing frequency of ativan.  Pt states "I would rather be snowed, Ill sign a DNR card"  This writer explained our goals to keep her safe and manage her withdrawel symptoms effectively.  Pt states she will probally leave sometime today after speaking with her fiancee.      IV to right forearm infilltrated, IV removed and warm compress applied, LR placed on hold at present time until new IV initiated.

## 2016-07-27 NOTE — Progress Notes (Signed)
[facility]  GI Consult  Follow Up Note      [name],[name], [id number] y.o. female  Date of Service: 07/27/2016  Date of Birth:  12/13/1990    Hospital Day:  LOS: 4 days     Chief Complaint:  Pancreatitis  Subjective:Still with upper abdominal pain and episodes of vomiting.  Objective:  Temperature: 37.1 C (98.8 F)  Heart Rate: 44  BP (Non-Invasive): (!) 149/86  Respiratory Rate: 18  SpO2-1: 100 %  Pain Score (Numeric, Faces): 8  Constitutional:  no distress  Respiratory:  Clear to auscultation bilaterally.   Cardiovascular:  regular rate and rhythm  Gastrointestinal:  soft,no rebound or guarding,no apparent increased pain with palpation  Neurologic:  Grossly normal    Labs:  I have reviewed all lab results.    Imaging Studies:  N/A    Assessment/Recommendations:  Suggest continue therapy,npo except meds,follow labs and tpn.Once pain better tart clear liquids.  Zara Chessimothy Wallace Cogliano, MD

## 2016-07-27 NOTE — ED Nurses Note (Signed)
Patient placed in a hospital gown.   I introduced myself with name and title.  Patient was placed on a blood pressure and O2 sat monitor, call bell placed within easy reach, patient educated on use of call bell/TV.  Side rails placed in the upright position, bed in low and locked position.  Patient was informed not to eat or drink anything until the Doctor has completed his/her exam.  Informed patient if they have to use the bathroom to let the Nursing staff know so we can collect a sample.

## 2016-07-28 ENCOUNTER — Inpatient Hospital Stay (HOSPITAL_BASED_OUTPATIENT_CLINIC_OR_DEPARTMENT_OTHER): Payer: Self-pay

## 2016-07-28 DIAGNOSIS — K860 Alcohol-induced chronic pancreatitis: Secondary | ICD-10-CM

## 2016-07-28 DIAGNOSIS — K700 Alcoholic fatty liver: Secondary | ICD-10-CM

## 2016-07-28 DIAGNOSIS — Z720 Tobacco use: Secondary | ICD-10-CM

## 2016-07-28 DIAGNOSIS — D6949 Other primary thrombocytopenia: Secondary | ICD-10-CM | POA: Insufficient documentation

## 2016-07-28 DIAGNOSIS — B1920 Unspecified viral hepatitis C without hepatic coma: Secondary | ICD-10-CM

## 2016-07-28 DIAGNOSIS — K852 Alcohol induced acute pancreatitis without necrosis or infection: Secondary | ICD-10-CM

## 2016-07-28 DIAGNOSIS — F1020 Alcohol dependence, uncomplicated: Secondary | ICD-10-CM

## 2016-07-28 LAB — RENAL FUNCTION PANEL
ALBUMIN: 2.4 g/dL — ABNORMAL LOW (ref 3.5–5.0)
ANION GAP: 5 mmol/L (ref 3–11)
BUN/CREA RATIO: 11 (ref 6–22)
BUN/CREA RATIO: 11 (ref 6–22)
BUN: 7 mg/dL (ref 6–20)
CALCIUM: 8.3 mg/dL — ABNORMAL LOW (ref 8.6–10.3)
CHLORIDE: 112 mmol/L — ABNORMAL HIGH (ref 101–111)
CO2 TOTAL: 25 mmol/L (ref 22–32)
CREATININE: 0.63 mg/dL (ref 0.44–1.00)
ESTIMATED GFR: >60 mL/min/1.73mˆ2 (ref >60)
GLUCOSE: 93 mg/dL (ref 70–110)
PHOSPHORUS: 4.7 mg/dL — ABNORMAL HIGH (ref 2.7–4.5)
POTASSIUM: 3.4 mmol/L (ref 3.4–5.1)
SODIUM: 142 mmol/L (ref 136–145)
SODIUM: 142 mmol/L (ref 136–145)

## 2016-07-28 LAB — CBC WITH DIFF
BASOPHIL #: 0 x10ˆ3/uL (ref 0.00–0.10)
BASOPHIL %: 0 % (ref 0–3)
EOSINOPHIL #: 0.2 10*3/uL (ref 0.00–0.50)
EOSINOPHIL #: 0.2 x10ˆ3/uL (ref 0.00–0.50)
EOSINOPHIL %: 3 % (ref 0–5)
HCT: 30 % — ABNORMAL LOW (ref 36.0–45.0)
HGB: 10.1 g/dL — ABNORMAL LOW (ref 12.0–15.5)
HGB: 10.1 g/dL — ABNORMAL LOW (ref 12.0–15.5)
LYMPHOCYTE #: 1.6 x10ˆ3/uL (ref 1.00–4.80)
LYMPHOCYTE %: 35 % (ref 15–43)
MCH: 32.6 pg (ref 27.5–33.2)
MCHC: 33.8 g/dL (ref 32.0–36.0)
MCV: 96.7 fL (ref 82.0–97.0)
MONOCYTE #: 0.4 x10ˆ3/uL (ref 0.20–0.90)
MONOCYTE %: 9 % (ref 5–12)
MPV: 9.7 fL (ref 7.4–10.5)
MPV: 9.7 fL (ref 7.4–10.5)
NEUTROPHIL #: 2.5 x10ˆ3/uL (ref 1.50–6.50)
NEUTROPHIL %: 53 % (ref 43–76)
PLATELETS: 95 10*3/uL — ABNORMAL LOW (ref 150–450)
PLATELETS: 95 x10ˆ3/uL — ABNORMAL LOW (ref 150–450)
RBC: 3.1 x10ˆ6/uL — ABNORMAL LOW (ref 4.00–5.10)
RDW: 14.3 % (ref 11.0–16.0)
WBC: 4.7 x10ˆ3/uL (ref 4.0–11.0)

## 2016-07-28 LAB — LIPASE: LIPASE: 407 U/L — ABNORMAL HIGH (ref 22–51)

## 2016-07-28 LAB — COMPREHENSIVE METABOLIC PROFILE - BMC/JMC ONLY
ALBUMIN/GLOBULIN RATIO: 1.3 (ref 0.8–2.0)
ALBUMIN: 2.4 g/dL — ABNORMAL LOW (ref 3.5–5.0)
ALKALINE PHOSPHATASE: 70 U/L (ref 38–126)
ALT (SGPT): 16 U/L (ref 14–54)
ANION GAP: 5 mmol/L (ref 3–11)
AST (SGOT): 26 U/L (ref 15–41)
BILIRUBIN TOTAL: 0.5 mg/dL (ref 0.3–1.2)
BUN/CREA RATIO: 11 (ref 6–22)
BUN/CREA RATIO: 11 (ref 6–22)
BUN: 7 mg/dL (ref 6–20)
CALCIUM: 8.3 mg/dL — ABNORMAL LOW (ref 8.6–10.3)
CHLORIDE: 112 mmol/L — ABNORMAL HIGH (ref 101–111)
CO2 TOTAL: 25 mmol/L (ref 22–32)
CREATININE: 0.63 mg/dL (ref 0.44–1.00)
ESTIMATED GFR: >60 mL/min/1.73mˆ2 (ref >60)
GLUCOSE: 93 mg/dL (ref 70–110)
GLUCOSE: 93 mg/dL (ref 70–110)
POTASSIUM: 3.4 mmol/L (ref 3.4–5.1)
PROTEIN TOTAL: 4.3 g/dL — ABNORMAL LOW (ref 6.4–8.3)
SODIUM: 142 mmol/L (ref 136–145)

## 2016-07-28 LAB — POC FINGERSTICK GLUCOSE - BMC/JMC (RESULTS)
GLUCOSE, POC: 126 mg/dL — ABNORMAL HIGH (ref 60–100)
GLUCOSE, POC: 93 mg/dL (ref 60–100)
GLUCOSE, POC: 84 mg/dL (ref 60–100)
GLUCOSE, POC: 79 mg/dL (ref 60–100)

## 2016-07-28 LAB — ECG 12-LEAD
Atrial Rate: 57 {beats}/min
Calculated P Axis: 64 degrees
PR Interval: 138 ms
QRS Duration: 88 ms
QT Interval: 420 ms
QTC Calculation: 408 ms
Ventricular rate: 57 {beats}/min

## 2016-07-28 LAB — HCG SERUM - BMC/JMC ONLY: HCG, QUALITATIVE SERUM: NEGATIVE

## 2016-07-28 LAB — PT/INR
INR: 1.18
PROTHROMBIN TIME: 13 s — ABNORMAL HIGH (ref 9.4–12.5)

## 2016-07-28 LAB — MAGNESIUM: MAGNESIUM: 1.5 mg/dL (ref 1.4–2.1)

## 2016-07-28 LAB — PREALBUMIN: PREALBUMIN: 16.1 mg/dL — ABNORMAL LOW (ref 18.0–38.0)

## 2016-07-28 LAB — TRIGLYCERIDE: TRIGLYCERIDES: 73 mg/dL (ref 35–135)

## 2016-07-28 MED ORDER — GASTROVIEW 15 ML IN 500 ML SW ORAL SOLUTION - CHI
500.00 mL | ORAL | Status: DC
Start: 2016-07-28 — End: 2016-07-31

## 2016-07-28 MED ORDER — FAT EMULSION 20 % IV (SCHEDULED, NO OVERFILL)
30.0000 g | Freq: Once | INTRAVENOUS | Status: DC
Start: 2016-07-29 — End: 2016-07-31
  Administered 2016-07-29: 0 kcal via INTRAVENOUS
  Filled 2016-07-28: qty 250

## 2016-07-28 MED ORDER — IOPAMIDOL 370 MG IODINE/ML (76 %) INTRAVENOUS SOLUTION
100.00 mL | INTRAVENOUS | Status: AC
Start: 2016-07-28 — End: 2016-07-28
  Administered 2016-07-28: 60 mL via INTRAVENOUS
  Filled 2016-07-28: qty 100

## 2016-07-28 MED ORDER — CALCIUM GLUCONATE 100 MG/ML (10 %) INTRAVENOUS SOLUTION
INTRAVENOUS | Status: AC
Start: 2016-07-28 — End: 2016-07-29
  Filled 2016-07-28: qty 2000

## 2016-07-28 MED ORDER — LORAZEPAM 2 MG/ML INJECTION SOLUTION
2.00 mg | INTRAMUSCULAR | Status: AC
Start: 2016-07-28 — End: 2016-07-28
  Administered 2016-07-28: 2 mg via INTRAMUSCULAR
  Filled 2016-07-28: qty 1

## 2016-07-28 MED ORDER — SODIUM CHLORIDE 0.9 % (FLUSH) INJECTION SYRINGE
10.00 mL | INJECTION | Freq: Three times a day (TID) | INTRAMUSCULAR | Status: DC
Start: 2016-07-28 — End: 2016-07-28
  Administered 2016-07-28: 10 mL via INTRAVENOUS

## 2016-07-28 MED ORDER — HEPARIN, PORCINE (PF) 10 UNIT/ML INTRAVENOUS SYRINGE
2.00 mL | INJECTION | Freq: Two times a day (BID) | INTRAVENOUS | Status: DC
Start: 2016-07-28 — End: 2016-07-30
  Administered 2016-07-29 (×2): 0 mL
  Administered 2016-07-29 – 2016-07-30 (×2): 2 mL
  Filled 2016-07-28 (×3): qty 5

## 2016-07-28 MED ORDER — PIPERACILLIN-TAZOBACTAM 3.375 GRAM/50 ML DEXTROSE(ISO-OS) IV PIGGYBACK
3.3750 g | INJECTION | Freq: Four times a day (QID) | INTRAVENOUS | Status: DC
Start: 2016-07-28 — End: 2016-07-28
  Filled 2016-07-28 (×4): qty 50

## 2016-07-28 MED ORDER — LORAZEPAM 2 MG/ML INJECTION SOLUTION
1.00 mg | INTRAMUSCULAR | Status: DC | PRN
Start: 2016-07-28 — End: 2016-07-31
  Administered 2016-07-29 – 2016-07-31 (×10): 1 mg via INTRAVENOUS
  Filled 2016-07-28 (×7): qty 1

## 2016-07-28 MED ORDER — PIPERACILLIN-TAZOBACTAM 3.375 GRAM/50 ML DEXTROSE(ISO-OS) IV PIGGYBACK
3.3750 g | INJECTION | Freq: Four times a day (QID) | INTRAVENOUS | Status: DC
Start: 2016-07-28 — End: 2016-07-29
  Administered 2016-07-28: 0 g via INTRAVENOUS
  Administered 2016-07-28 – 2016-07-29 (×2): 3.375 g via INTRAVENOUS
  Administered 2016-07-29: 0 g via INTRAVENOUS
  Administered 2016-07-29: 3.375 g via INTRAVENOUS
  Filled 2016-07-28 (×7): qty 50

## 2016-07-28 MED ADMIN — Medication: INTRAVENOUS | @ 22:00:00

## 2016-07-28 MED ADMIN — CEFAZOLIN 1 G IN NS IRRIGATION: INTRAVENOUS | @ 22:00:00 | NDC 63323023710

## 2016-07-28 NOTE — Progress Notes (Signed)
[facility]  [place], [place] [address]    IP PROGRESS NOTE      [name], [name]  Date of Admission:  07/27/2016  Date of Birth:  [birth year]  Date of Service:  07/28/2016    Chief Complaint: epigastric pain radiating to back.  Subjective: 3 loose BM yesterday. No BM today.    Vital Signs:  Temp (24hrs) Max:36.7 C (98 F)      Temperature: 36.6 C (97.8 F)  BP (Non-Invasive): 134/82  MAP (Non-Invasive): 93 mmHG  Heart Rate: (!) 39  Respiratory Rate: 16  Pain Score (Numeric, Faces): 9  SpO2-1: 96 %    Current Medications:    Current Facility-Administered Medications:  adult custom parenteral nutrition with CLINIMIX  Intravenous Continuous   diatrizoate meglumine & sodium (MD-GASTROVIEW) 15 mL in 500 mL SW oral solution 500 mL Oral Give in Radiology   enoxaparin (LOVENOX) 40 mg/0.4 mL SubQ injection 40 mg Subcutaneous Daily   [START ON 07/29/2016] fat emulsion (INTRALIPID) 20% infusion 30 g Intravenous Once   HYDROmorphone (DILAUDID) 2 mg/mL injection 1 mg Intravenous Q3H PRN   LORazepam (ATIVAN) 2 mg/mL injection 1 mg Intravenous Q6H PRN   LR premix infusion  Intravenous Continuous   nicotine (NICODERM CQ) transdermal patch (mg/24 hr) 21 mg Transdermal Daily   nitroGLYCERIN (NITROSTAT) sublingual tablet 0.4 mg Sublingual Q5 Min PRN   NS flush syringe 10 mL Intravenous Q8HRS   NS flush syringe 10 mL Intravenous Q1H PRN   ondansetron (ZOFRAN) 2 mg/mL injection 4 mg Intravenous Q4H PRN   pantoprazole (PROTONIX) injection 40 mg Intravenous Daily   Pharmacy TPN Consult  Intravenous Daily PRN   piperacillin-tazobactam (ZOSYN) 3.375 g in iso-osmotic 50 mL premix IVPB 3.375 g Intravenous Q6H       Today's Physical Exam:    GENERAL: Patient is alert, awake and oriented X 3. In no cardio respiratory distress.   HEENT: PERRLA, EOMI.   NECK: Supple, NO JVD.   HEART: S1+, S2+, rate controlled and rhythm regular. No murmurs appreciated.   LUNGS: Clear to auscultation bilaterally.   ABDOMEN: Soft,  epigastric tenderness, no guarding, ND with NABS.   EXTREMITIES: No edema, pedal pulses palpable.   NEURO: Cranial nerves 2 to 12 grossly intact, Motor exam is non focal.   SKIN: No rashes or bruises.   PSYCH: Pleasant with no signs of depression.      I/O:  I/O last 24 hours:    Intake/Output Summary (Last 24 hours) at 07/28/16 1732  Last data filed at 07/28/16 1359   Gross per 24 hour   Intake           2173.6 ml   Output             1150 ml   Net           1023.6 ml     I/O current shift:       Nutrition/Residuals:  DIET NPO - NOW STRICT, EXCEPT ALL MEDS WITH SIPS OF WATER  adult custom parenteral nutrition with CLINIMIX    Labs    Reviewed:   Lab Results for Last 24 Hours:    Results for orders placed or performed during the hospital encounter of 07/27/16 (from the past 24 hour(s))   COMPREHENSIVE METABOLIC PROFILE - [facility] ONLY   Result Value Ref Range    SODIUM 141 136 - 145 mmol/L    POTASSIUM 3.3 (L) 3.4 - 5.1 mmol/L    CHLORIDE 104 101 -  111 mmol/L    CO2 TOTAL 28 22 - 32 mmol/L    ANION GAP 9 3 - 11 mmol/L    BUN 6 6 - 20 mg/dL    CREATININE 1.61 0.96 - 1.00 mg/dL    BUN/CREA RATIO 9 6 - 22    ESTIMATED GFR >60 >60 mL/min/1.47m2    ALBUMIN 3.5 3.5 - 5.0 g/dL    CALCIUM 9.0 8.6 - 04.5 mg/dL    GLUCOSE 409 (H) 70 - 110 mg/dL    ALKALINE PHOSPHATASE 93 38 - 126 U/L    ALT (SGPT) 24 14 - 54 U/L    AST (SGOT) 40 15 - 41 U/L    BILIRUBIN TOTAL 1.2 0.3 - 1.2 mg/dL    PROTEIN TOTAL 6.3 (L) 6.4 - 8.3 g/dL    ALBUMIN/GLOBULIN RATIO 1.3 0.8 - 2.0   URINALYSIS WITH MICROSCOPIC REFLEX IF INDICATED [facility] ONLY   Result Value Ref Range    COLOR Light Yellow Light Yellow, Straw, Yellow    APPEARANCE Clear Clear    PH 7.0 <8.0    LEUKOCYTES Negative Negative WBCs/uL    NITRITE Negative Negative    PROTEIN Negative Negative mg/dL    GLUCOSE Negative Negative mg/dL    KETONES Negative Negative mg/dL    UROBILINOGEN 2.0  <=8.1 mg/dL    BILIRUBIN Negative Negative mg/dL    BLOOD Negative Negative mg/dL    SPECIFIC GRAVITY  1.914 <1.022   CBC WITH DIFF   Result Value Ref Range    WBC 4.9 4.0 - 11.0 x103/uL    RBC 3.97 (L) 4.00 - 5.10 x106/uL    HGB 12.6 12.0 - 15.5 g/dL    HCT 78.2 95.6 - 21.3 %    MCV 96.4 82.0 - 97.0 fL    MCH 31.8 27.5 - 33.2 pg    MCHC 32.9 32.0 - 36.0 g/dL    RDW 08.6 57.8 - 46.9 %    PLATELETS 111 (L) 150 - 450 x103/uL    MPV 10.0 7.4 - 10.5 fL    NEUTROPHIL % 67 43 - 76 %    LYMPHOCYTE % 24 15 - 43 %    MONOCYTE % 8 5 - 12 %    EOSINOPHIL % 1 0 - 5 %    BASOPHIL % 0 0 - 3 %    NEUTROPHIL # 3.30 1.50 - 6.50 x103/uL    LYMPHOCYTE # 1.20 1.00 - 4.80 x103/uL    MONOCYTE # 0.40 0.20 - 0.90 x103/uL    EOSINOPHIL # 0.10 0.00 - 0.50 x103/uL    BASOPHIL # 0.00 0.00 - 0.10 x103/uL   Lipase   Result Value Ref Range    LIPASE 689 (H) 22 - 51 U/L   MAGNESIUM   Result Value Ref Range    MAGNESIUM 1.7 1.4 - 2.1 mg/dL   PHOSPHORUS   Result Value Ref Range    PHOSPHORUS 5.0 (H) 2.7 - 4.5 mg/dL   ETHANOL, SERUM   Result Value Ref Range    ETHANOL <10 <10 mg/dL    ETHANOL Not Detected    RENAL FUNCTION PANEL - TPN TODAY   Result Value Ref Range    SODIUM 138 136 - 145 mmol/L    POTASSIUM 3.2 (L) 3.4 - 5.1 mmol/L    CHLORIDE 109 101 - 111 mmol/L    CO2 TOTAL 24 22 - 32 mmol/L    ANION GAP 5 3 - 11 mmol/L    BUN 7 6 - 20  mg/dL    CREATININE 1.61 0.96 - 1.00 mg/dL    BUN/CREA RATIO 13 6 - 22    ESTIMATED GFR >60 >60 mL/min/1.61m2    CALCIUM 8.3 (L) 8.6 - 10.3 mg/dL    GLUCOSE 045 70 - 409 mg/dL    PHOSPHORUS 4.7 (H) 2.7 - 4.5 mg/dL    ALBUMIN 2.5 (L) 3.5 - 5.0 g/dL   MAGNESIUM - TPN TODAY   Result Value Ref Range    MAGNESIUM 1.5 1.4 - 2.1 mg/dL   LIPASE   Result Value Ref Range    LIPASE 407 (H) 22 - 51 U/L   COMPREHENSIVE METABOLIC PROFILE - [facility] ONLY   Result Value Ref Range    SODIUM 142 136 - 145 mmol/L    POTASSIUM 3.4 3.4 - 5.1 mmol/L    CHLORIDE 112 (H) 101 - 111 mmol/L    CO2 TOTAL 25 22 - 32 mmol/L    ANION GAP 5 3 - 11 mmol/L    BUN 7 6 - 20 mg/dL    CREATININE 8.11 9.14 - 1.00 mg/dL    BUN/CREA RATIO 11 6 - 22     ESTIMATED GFR >60 >60 mL/min/1.54m2    ALBUMIN 2.4 (L) 3.5 - 5.0 g/dL    CALCIUM 8.3 (L) 8.6 - 10.3 mg/dL    GLUCOSE 93 70 - 782 mg/dL    ALKALINE PHOSPHATASE 70 38 - 126 U/L    ALT (SGPT) 16 14 - 54 U/L    AST (SGOT) 26 15 - 41 U/L    BILIRUBIN TOTAL 0.5 0.3 - 1.2 mg/dL    PROTEIN TOTAL 4.3 (L) 6.4 - 8.3 g/dL    ALBUMIN/GLOBULIN RATIO 1.3 0.8 - 2.0   MAGNESIUM   Result Value Ref Range    MAGNESIUM 1.5 1.4 - 2.1 mg/dL   PT/INR   Result Value Ref Range    PROTHROMBIN TIME 13.0 (H) 9.4 - 12.5 seconds    INR 1.18    RENAL FUNCTION PANEL - TPN DAY 1   Result Value Ref Range    SODIUM 142 136 - 145 mmol/L    POTASSIUM 3.4 3.4 - 5.1 mmol/L    CHLORIDE 112 (H) 101 - 111 mmol/L    CO2 TOTAL 25 22 - 32 mmol/L    ANION GAP 5 3 - 11 mmol/L    BUN 7 6 - 20 mg/dL    CREATININE 9.56 2.13 - 1.00 mg/dL    BUN/CREA RATIO 11 6 - 22    ESTIMATED GFR >60 >60 mL/min/1.70m2    CALCIUM 8.3 (L) 8.6 - 10.3 mg/dL    GLUCOSE 93 70 - 086 mg/dL    PHOSPHORUS 4.7 (H) 2.7 - 4.5 mg/dL    ALBUMIN 2.4 (L) 3.5 - 5.0 g/dL   TRIGLYCERIDE - TOMORROW AM - TPN   Result Value Ref Range    TRIGLYCERIDES 73 35 - 135 mg/dL   PREALBUMIN - EVERY MONDAY  FOR 2 WEEKS - TPN   Result Value Ref Range    PREALBUMIN 16.1 (L) 18.0 - 38.0 mg/dL   CBC WITH DIFF   Result Value Ref Range    WBC 4.7 4.0 - 11.0 x103/uL    RBC 3.10 (L) 4.00 - 5.10 x106/uL    HGB 10.1 (L) 12.0 - 15.5 g/dL    HCT 57.8 (L) 46.9 - 45.0 %    MCV 96.7 82.0 - 97.0 fL    MCH 32.6 27.5 - 33.2 pg    MCHC 33.8 32.0 -  36.0 g/dL    RDW 16.114.3 09.611.0 - 04.516.0 %    PLATELETS 95 (L) 150 - 450 x103/uL    MPV 9.7 7.4 - 10.5 fL    NEUTROPHIL % 53 43 - 76 %    LYMPHOCYTE % 35 15 - 43 %    MONOCYTE % 9 5 - 12 %    EOSINOPHIL % 3 0 - 5 %    BASOPHIL % 0 0 - 3 %    NEUTROPHIL # 2.50 1.50 - 6.50 x103/uL    LYMPHOCYTE # 1.60 1.00 - 4.80 x103/uL    MONOCYTE # 0.40 0.20 - 0.90 x103/uL    EOSINOPHIL # 0.20 0.00 - 0.50 x103/uL    BASOPHIL # 0.00 0.00 - 0.10 x103/uL   HCG SERUM - [facility] ONLY   Result Value Ref  Range    HCG, QUALITATIVE SERUM Negative Negative   POC FINGERSTICK GLUCOSE - [facility] (RESULTS)   Result Value Ref Range    GLUCOSE, POC 126 (H) 60 - 100 mg/dL   POC FINGERSTICK GLUCOSE - [facility] (RESULTS)   Result Value Ref Range    GLUCOSE, POC 93 60 - 100 mg/dL   POC FINGERSTICK GLUCOSE - [facility] (RESULTS)   Result Value Ref Range    GLUCOSE, POC 84 60 - 100 mg/dL         Diagnostic Tests   Reviewed    Problem List:  Active Hospital Problems   (*Primary Problem)    Diagnosis    *Acute alcoholic pancreatitis    Primary thrombocytopenia       Assessment/ Plan:  Acute epigastric pain-unchanged despite aggressive hydration for pancreatitis.  The patient left AMA yesterday.  Repeat CT scan shows thickened gallbladder with no pancreatic necrosis.  Will continue IV fluids, TPN, p.r.n. Zofran.  Will check HIDA scan.  And consult surgery for suspected cholecystitis.  Will start Zosyn empirically.  Prn pain meds.    Alcohol Abuse: will need crisis worker consult when acute issues resolve.  IV Ativan PRN for alcohol withdrawal.     Acute Hypokalemia- resolved.     Thrombocytopenia: alcohol related marrow suppression, no bleeding, monitor counts.    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 Will need to resume this once she is off IV pain medications.     Tobacco Use Disorder: counseled on cessation. Nicoderm ordered.     DVT PPx: Lovenox  Code Status:  Full Code  Jewel BaizeSarika Abubakar Crispo, MD 07/28/2016, 17:32

## 2016-07-28 NOTE — Ancillary Notes (Signed)
[facility]  Medical Nutrition Therapy Assessment      Reason for Assessment: Consult and High Risk Notification: Decreased weight  Priority level:  1  Follow in 2-3 days.    SUBJECTIVE :   To start PPN - pt signed out AMA and came back same day due to persistent inability to tolerate PO.    OBJECTIVE:  Height: 162.6 cm (5\' 4" )  Weight: 55.8 kg (123 lb)  BMI (Calculated): 21.16    IBW:  55 kg  ABW:  n/a  UBW:  Per previous admission, pt reported an 18-20# weight loss.      Labs:   I have reviewed all lab results.  Lab Results for Last 24 Hours:    Results for orders placed or performed during the hospital encounter of 07/27/16 (from the past 24 hour(s))   COMPREHENSIVE METABOLIC PROFILE - [facility] ONLY   Result Value Ref Range    SODIUM 141 136 - 145 mmol/L    POTASSIUM 3.3 (L) 3.4 - 5.1 mmol/L    CHLORIDE 104 101 - 111 mmol/L    CO2 TOTAL 28 22 - 32 mmol/L    ANION GAP 9 3 - 11 mmol/L    BUN 6 6 - 20 mg/dL    CREATININE 1.61 0.96 - 1.00 mg/dL    BUN/CREA RATIO 9 6 - 22    ESTIMATED GFR >60 >60 mL/min/1.65m2    ALBUMIN 3.5 3.5 - 5.0 g/dL    CALCIUM 9.0 8.6 - 04.5 mg/dL    GLUCOSE 409 (H) 70 - 110 mg/dL    ALKALINE PHOSPHATASE 93 38 - 126 U/L    ALT (SGPT) 24 14 - 54 U/L    AST (SGOT) 40 15 - 41 U/L    BILIRUBIN TOTAL 1.2 0.3 - 1.2 mg/dL    PROTEIN TOTAL 6.3 (L) 6.4 - 8.3 g/dL    ALBUMIN/GLOBULIN RATIO 1.3 0.8 - 2.0   URINALYSIS WITH MICROSCOPIC REFLEX IF INDICATED [facility] ONLY   Result Value Ref Range    COLOR Light Yellow Light Yellow, Straw, Yellow    APPEARANCE Clear Clear    PH 7.0 <8.0    LEUKOCYTES Negative Negative WBCs/uL    NITRITE Negative Negative    PROTEIN Negative Negative mg/dL    GLUCOSE Negative Negative mg/dL    KETONES Negative Negative mg/dL    UROBILINOGEN 2.0  <=8.1 mg/dL    BILIRUBIN Negative Negative mg/dL    BLOOD Negative Negative mg/dL    SPECIFIC GRAVITY 1.914 <1.022   CBC WITH DIFF   Result Value Ref Range    WBC 4.9 4.0 - 11.0 x103/uL    RBC 3.97 (L) 4.00 - 5.10 x106/uL       HGB 12.6 12.0 - 15.5 g/dL    HCT 78.2 95.6 - 21.3 %    MCV 96.4 82.0 - 97.0 fL    MCH 31.8 27.5 - 33.2 pg    MCHC 32.9 32.0 - 36.0 g/dL    RDW 08.6 57.8 - 46.9 %    PLATELETS 111 (L) 150 - 450 x103/uL    MPV 10.0 7.4 - 10.5 fL    NEUTROPHIL % 67 43 - 76 %    LYMPHOCYTE % 24 15 - 43 %    MONOCYTE % 8 5 - 12 %    EOSINOPHIL % 1 0 - 5 %    BASOPHIL % 0 0 - 3 %    NEUTROPHIL # 3.30 1.50 - 6.50 x103/uL    LYMPHOCYTE #  1.20 1.00 - 4.80 x103/uL    MONOCYTE # 0.40 0.20 - 0.90 x103/uL    EOSINOPHIL # 0.10 0.00 - 0.50 x103/uL    BASOPHIL # 0.00 0.00 - 0.10 x103/uL   Lipase   Result Value Ref Range    LIPASE 689 (H) 22 - 51 U/L   MAGNESIUM   Result Value Ref Range    MAGNESIUM 1.7 1.4 - 2.1 mg/dL   PHOSPHORUS   Result Value Ref Range    PHOSPHORUS 5.0 (H) 2.7 - 4.5 mg/dL   ETHANOL, SERUM   Result Value Ref Range    ETHANOL <10 <10 mg/dL    ETHANOL Not Detected    RENAL FUNCTION PANEL - TPN TODAY   Result Value Ref Range    SODIUM 138 136 - 145 mmol/L    POTASSIUM 3.2 (L) 3.4 - 5.1 mmol/L    CHLORIDE 109 101 - 111 mmol/L    CO2 TOTAL 24 22 - 32 mmol/L    ANION GAP 5 3 - 11 mmol/L    BUN 7 6 - 20 mg/dL    CREATININE 1.61 0.96 - 1.00 mg/dL    BUN/CREA RATIO 13 6 - 22    ESTIMATED GFR >60 >60 mL/min/1.42m2    CALCIUM 8.3 (L) 8.6 - 10.3 mg/dL    GLUCOSE 045 70 - 409 mg/dL    PHOSPHORUS 4.7 (H) 2.7 - 4.5 mg/dL    ALBUMIN 2.5 (L) 3.5 - 5.0 g/dL   MAGNESIUM - TPN TODAY   Result Value Ref Range    MAGNESIUM 1.5 1.4 - 2.1 mg/dL   COMPREHENSIVE METABOLIC PROFILE - [facility] ONLY   Result Value Ref Range    SODIUM 142 136 - 145 mmol/L    POTASSIUM 3.4 3.4 - 5.1 mmol/L    CHLORIDE 112 (H) 101 - 111 mmol/L    CO2 TOTAL 25 22 - 32 mmol/L    ANION GAP 5 3 - 11 mmol/L    BUN 7 6 - 20 mg/dL    CREATININE 8.11 9.14 - 1.00 mg/dL    BUN/CREA RATIO 11 6 - 22    ESTIMATED GFR >60 >60 mL/min/1.67m2    ALBUMIN 2.4 (L) 3.5 - 5.0 g/dL    CALCIUM 8.3 (L) 8.6 - 10.3 mg/dL    GLUCOSE 93 70 - 782 mg/dL    ALKALINE PHOSPHATASE 70 38 - 126 U/L    ALT  (SGPT) 16 14 - 54 U/L    AST (SGOT) 26 15 - 41 U/L    BILIRUBIN TOTAL 0.5 0.3 - 1.2 mg/dL    PROTEIN TOTAL 4.3 (L) 6.4 - 8.3 g/dL    ALBUMIN/GLOBULIN RATIO 1.3 0.8 - 2.0   MAGNESIUM   Result Value Ref Range    MAGNESIUM 1.5 1.4 - 2.1 mg/dL   PT/INR   Result Value Ref Range    PROTHROMBIN TIME 13.0 (H) 9.4 - 12.5 seconds    INR 1.18    RENAL FUNCTION PANEL - TPN DAY 1   Result Value Ref Range    SODIUM 142 136 - 145 mmol/L    POTASSIUM 3.4 3.4 - 5.1 mmol/L    CHLORIDE 112 (H) 101 - 111 mmol/L    CO2 TOTAL 25 22 - 32 mmol/L    ANION GAP 5 3 - 11 mmol/L    BUN 7 6 - 20 mg/dL    CREATININE 9.56 2.13 - 1.00 mg/dL    BUN/CREA RATIO 11 6 - 22    ESTIMATED GFR >60 >60  mL/min/1.3673m2    CALCIUM 8.3 (L) 8.6 - 10.3 mg/dL    GLUCOSE 93 70 - 161110 mg/dL    PHOSPHORUS 4.7 (H) 2.7 - 4.5 mg/dL    ALBUMIN 2.4 (L) 3.5 - 5.0 g/dL   TRIGLYCERIDE - TOMORROW AM - TPN   Result Value Ref Range    TRIGLYCERIDES 73 35 - 135 mg/dL   PREALBUMIN - EVERY MONDAY  FOR 2 WEEKS - TPN   Result Value Ref Range    PREALBUMIN 16.1 (L) 18.0 - 38.0 mg/dL   CBC WITH DIFF   Result Value Ref Range    WBC 4.7 4.0 - 11.0 x103/uL    RBC 3.10 (L) 4.00 - 5.10 x106/uL    HGB 10.1 (L) 12.0 - 15.5 g/dL    HCT 09.630.0 (L) 04.536.0 - 45.0 %    MCV 96.7 82.0 - 97.0 fL    MCH 32.6 27.5 - 33.2 pg    MCHC 33.8 32.0 - 36.0 g/dL    RDW 40.914.3 81.111.0 - 91.416.0 %    PLATELETS 95 (L) 150 - 450 x103/uL    MPV 9.7 7.4 - 10.5 fL    NEUTROPHIL % 53 43 - 76 %    LYMPHOCYTE % 35 15 - 43 %    MONOCYTE % 9 5 - 12 %    EOSINOPHIL % 3 0 - 5 %    BASOPHIL % 0 0 - 3 %    NEUTROPHIL # 2.50 1.50 - 6.50 x103/uL    LYMPHOCYTE # 1.60 1.00 - 4.80 x103/uL    MONOCYTE # 0.40 0.20 - 0.90 x103/uL    EOSINOPHIL # 0.20 0.00 - 0.50 x103/uL    BASOPHIL # 0.00 0.00 - 0.10 x103/uL   HCG SERUM - [facility] ONLY   Result Value Ref Range    HCG, QUALITATIVE SERUM Negative Negative   POC FINGERSTICK GLUCOSE - [facility] (RESULTS)   Result Value Ref Range    GLUCOSE, POC 126 (H) 60 - 100 mg/dL       Meds:   Current  Facility-Administered Medications:  diatrizoate meglumine & sodium (MD-GASTROVIEW) 15 mL in 500 mL SW oral solution 500 mL Oral Give in Radiology   enoxaparin (LOVENOX) 40 mg/0.4 mL SubQ injection 40 mg Subcutaneous Daily   HYDROmorphone (DILAUDID) 2 mg/mL injection 1 mg Intravenous Q3H PRN   LORazepam (ATIVAN) 2 mg/mL injection 1 mg Intravenous Q6H PRN   LR premix infusion  Intravenous Continuous   nicotine (NICODERM CQ) transdermal patch (mg/24 hr) 21 mg Transdermal Daily   nitroGLYCERIN (NITROSTAT) sublingual tablet 0.4 mg Sublingual Q5 Min PRN   NS flush syringe 10 mL Intravenous Q8HRS   NS flush syringe 10 mL Intravenous Q1H PRN   ondansetron (ZOFRAN) 2 mg/mL injection 4 mg Intravenous Q4H PRN   pantoprazole (PROTONIX) injection 40 mg Intravenous Daily   Pharmacy TPN Consult  Intravenous Daily PRN     Past Medical History:   Diagnosis Date    ETOH abuse     Hepatitis C     Heroin abuse        Comments:   Pt is a  25 yo female with acute pancreatitis - CT ordered to check again for pancreatic necrosis. Hx of drug and ETOH abuse.   Braden = 23; nutrition = 4; excellent?  GI symptoms include nausea and abdominal pain.   Currently NPO - to start PPN as has peripheral line.   Nutritional needs estimated at:   1674 cal (25 cal/kg) CBW  67-84 gm Protein (1.2-1.5 gm/kg) CBW   1674 ml Fluids (25 ml/kg) CBW    Nutritional Education needs:  None    DIAGNOSIS / INTERVENTION  Nutrition Diagnosis: Inadequate protein-energy intake ; related to Current medical condition  Pathophysiological  Altered GI length/function/motility ; as evidenced by inability to tolerate PO, Unintentional weight loss and GI symptoms abdominal pain/nausea    Nutrition Intervention :   1. Parenteral Nutrition   Nutrition Prescription:     1. Clinimix 4.25/5 at goal rate 80 ml/hr with 30 gm IL for 82 gm protein and 953 kcal.  Please check PAB and TG.        Goals:  Maintain Weight of CBW  Tolerate diet/ nutrition support  Meet estimated needs for  nutrients/fluids  Transition to PPN    Monitoring / Evaluation: Monitor: Weight Status and Pertinent Labs TG, PAB, glucose, albumin  Tolerance of : nutrition support          Isabell Jarvis, RDLD  07/28/2016, 11:26    Phone:  [phone]

## 2016-07-28 NOTE — Nurses Notes (Signed)
Bedside report given by Angelena SoleSusana, RN. Patient in bed asking for us to contact her Dortha KernFiance, Marcus. Telephone call placed and not answered. Patient wants to be able to get her medicine via IV or she would like to be transferred to another facility.

## 2016-07-28 NOTE — Care Plan (Signed)
Problem: Patient Care Overview (Adult,OB)  Goal: Plan of Care Review(Adult,OB)  The patient and/or their representative will communicate an understanding of their plan of care   Outcome: Ongoing (see interventions/notes)    Problem: Depression (Adult,Obstetrics,Pediatric)  Goal: Identify Related Risk Factors and Signs and Symptoms  Related risk factors and signs and symptoms are identified upon initiation of Human Response Clinical Practice Guideline (CPG).   Outcome: Ongoing (see interventions/notes)    07/28/16 0725   Depression   Related Risk Factors (Depression) history of depression         Problem: Pain, Acute (Adult)  Goal: Identify Related Risk Factors and Signs and Symptoms  Related risk factors and signs and symptoms are identified upon initiation of Human Response Clinical Practice Guideline (CPG).   Outcome: Ongoing (see interventions/notes)    07/28/16 0725   Pain, Acute   Related Risk Factors (Acute Pain) disease process;persistent pain         Problem: Fall Risk (Adult)  Goal: Identify Related Risk Factors and Signs and Symptoms  Related risk factors and signs and symptoms are identified upon initiation of Human Response Clinical Practice Guideline (CPG).   Outcome: Ongoing (see interventions/notes)    Comments:   Patient verbalized understanding, no needs at this time, call light within reach, assuming care at this time, will continue to monitor.

## 2016-07-28 NOTE — Progress Notes (Signed)
Biliary scan will be done tomorrow in the AM.  Please keep patient NPO after midnight and no morphine or dilaudid after midnight.  RN notified.

## 2016-07-28 NOTE — Nurses Notes (Signed)
Brother at bedside 

## 2016-07-28 NOTE — Pharmacy (Signed)
TPN Therapy Follow Up  07/28/2016       [name],[name]  Date of Birth:  09/25/1991    TPN Base: (Clinimix) amino acids 4.25% infusion in D5W  Lipids: yes  Central Line: no  Rate: 62 mL/hr      TPN Order Summary      The values shown are based on the patient receiving 0.71 bags over 24 hours.           Range     Amino Acids (g) 60.35 --     Dextrose (g) 71 --     Dextrose Concentration (%) 4.79 --     Volume 1,483.19 --     Infusion Rate (mL/hr) 62      Infusion Site Central      Multi-vitamins (mL) 7.1 --     Trace Elements (mL) 0.71 --     Weight Used 55.8 kg         Caloric Contribution          kcal/kg kcal %     Protein 4.33 241.4 50     Dextrose 4.33 241.4 50     Lipids -- -- --     Total 8.65 482.8                Electrolytes      Cations Amount Range     Sodium (mEq) 39.05 --     Potassium (mEq) 60.11 --     Calcium (mEq) 3.57 --     Magnesium (mEq) 11.08 --     Anions           Phosphate (mmol) 7.1 --     Chloride (mEq) 66.74 --     Acetate (mEq) 98.69 --        Mixture Compatibility            Range     Calcium Phosphate Solubility Curve (mEq/L of Calcium) 2.4 <=48.34     Osmolarity 799.7 --     Calcium: Phosphate Ratio (mEq: mmol) 0.5 --                  Comments: Decreased sodium and phosphate, increased potassium and magnesium. Changed Clinimix to formula without electrolytes due to increasing serum phosphate.     [facility].  Please contact the pharmacy with any questions.    Earlie ServerCarol Carmelita Amparo, [facility] Hospital[facility]ARMD  07/28/2016 12:44

## 2016-07-28 NOTE — Procedures (Signed)
Central Line Placement      Procedure Date:  07/28/2016 Time:  1945  Procedure: Central Line Placement  Diagnosis:  Acute pancreatitis  Indication: venous access    Description: After informed consent a surgical time out was performed to confirm correct patient and procedure. The site was identified and prepped in the usual sterile fashion. The skin was prepped with chlorhexidine and 1%, Lidocaine plain analgesia was used.  A triple  lumen central line was placed  was inserted in the left subclavian vein with patient in trendelenburg position using the Seldinger Technique.  The vein was cannulated and the guidewire was placed.  The needle was removed and the dilator advanced. The catheter was placed over the guidewire and the guidewire was then removed.  Ultrasound guidance was not required.  All ports drew blood back and flushed easily.  The line was sutured in place and dressed in sterile fashion. CXR ordered and reviewed at bedside to verify placement. Patient tolerated procedure without complications.       Karolee OhsJeffrey Blake Toney Difatta, DO 07/28/2016, 30:[id number]:[id number]

## 2016-07-28 NOTE — Nurses Notes (Signed)
Report received from X-Ray that CVL is in place.

## 2016-07-28 NOTE — Progress Notes (Signed)
[facility]  [facility]  Follow Up Note      [name],[name], 25 y.o. female  Date of Service: 07/28/2016  Date of Birth:  04/03/1991    Hospital Day:  LOS: 1 day     Chief Complaint:  Pancreatitis  Subjective: Pt has comnntinued nausea  And abdominal pain.    Objective:  Temperature: 36.7 C (98 F)  Heart Rate: 61  BP (Non-Invasive): 118/90  Respiratory Rate: 18  SpO2-1: (!) 87 %  Pain Score (Numeric, Faces): 9  Constitutional:  no distress  Respiratory:  Clear to auscultation bilaterally. , anteriorly  Cardiovascular:  regular rate and rhythm  Gastrointestinal:  Soft, non-tender, to palpation  Neurologic:  sleepy but arousable and appropriate    Labs:  I have reviewed all lab results.    Imaging Studies:  ct scan abd pelvis with and without contrast ordered to look again for pancreatic necrosis    Assessment/Recommendations:  Pt left AMA yesterday and returned several hours later.Continue NPO except meds/Check ct scan for development of pancreatitis complications and necrosis/Follow labs/Continue TPN/Continue IV fluid  [name] Chessimothy Jayd Cadieux, MD

## 2016-07-28 NOTE — Nurses Notes (Signed)
2 unsuccessful IV attempts from this nurse.

## 2016-07-28 NOTE — Nurses Notes (Signed)
Attempted IV access x 2. Unsuccessful. 

## 2016-07-28 NOTE — Nurses Notes (Signed)
Patient in bed eating scrambled egg with coffee at the bedside. Educated on NPO orders from the doctor. Will provide printed information on pancreatitis.

## 2016-07-28 NOTE — Progress Notes (Signed)
CT reviewed. Pancreas reported as normal no necrosis.GB contracted edematous and pericholecystic fluid,? From recent pancreatitis versus acalculos cholecystitis. Suggest surgical consult for opinion,may need HIDA scan-leave for surgery to consider ,she has been npo for almost a week.Above discussed with Dr.Bagree.

## 2016-07-29 ENCOUNTER — Inpatient Hospital Stay (HOSPITAL_BASED_OUTPATIENT_CLINIC_OR_DEPARTMENT_OTHER): Payer: Self-pay

## 2016-07-29 DIAGNOSIS — G8929 Other chronic pain: Secondary | ICD-10-CM

## 2016-07-29 DIAGNOSIS — K859 Acute pancreatitis without necrosis or infection, unspecified: Secondary | ICD-10-CM

## 2016-07-29 LAB — CBC WITH DIFF
BASOPHIL #: 0 x10ˆ3/uL (ref 0.00–0.10)
BASOPHIL %: 0 % (ref 0–3)
EOSINOPHIL #: 0.1 x10ˆ3/uL (ref 0.00–0.50)
HCT: 32.6 % — ABNORMAL LOW (ref 36.0–45.0)
HGB: 10.9 g/dL — ABNORMAL LOW (ref 12.0–15.5)
LYMPHOCYTE %: 35 % (ref 15–43)
MCH: 32.3 pg (ref 27.5–33.2)
MCHC: 33.4 g/dL (ref 32.0–36.0)
MCV: 96.8 fL (ref 82.0–97.0)
MONOCYTE #: 0.3 x10ˆ3/uL (ref 0.20–0.90)
MONOCYTE %: 9 % (ref 5–12)
MPV: 10.6 fL — ABNORMAL HIGH (ref 7.4–10.5)
NEUTROPHIL #: 2 x10ˆ3/uL (ref 1.50–6.50)
NEUTROPHIL %: 52 % (ref 43–76)
PLATELETS: 110 x10ˆ3/uL — ABNORMAL LOW (ref 150–450)
RBC: 3.37 x10ˆ6/uL — ABNORMAL LOW (ref 4.00–5.10)
RDW: 14.2 % (ref 11.0–16.0)
WBC: 3.8 x10ˆ3/uL — ABNORMAL LOW (ref 4.0–11.0)

## 2016-07-29 LAB — RENAL FUNCTION PANEL
ALBUMIN: 2.6 g/dL — ABNORMAL LOW (ref 3.5–5.0)
ANION GAP: 7 mmol/L (ref 3–11)
BUN/CREA RATIO: 7 (ref 6–22)
BUN: 5 mg/dL — ABNORMAL LOW (ref 6–20)
CALCIUM: 8.6 mg/dL (ref 8.6–10.3)
CHLORIDE: 109 mmol/L (ref 101–111)
CO2 TOTAL: 28 mmol/L (ref 22–32)
CREATININE: 0.71 mg/dL (ref 0.44–1.00)
ESTIMATED GFR: >60 mL/min/1.73m?2 (ref >60)
GLUCOSE: 101 mg/dL (ref 70–110)
PHOSPHORUS: 4.8 mg/dL — ABNORMAL HIGH (ref 2.7–4.5)
POTASSIUM: 3.7 mmol/L (ref 3.4–5.1)
SODIUM: 144 mmol/L (ref 136–145)

## 2016-07-29 LAB — CBC W/AUTO DIFF
BASOPHIL #: 0 x10ˆ3/uL (ref 0.00–0.10)
BASOPHIL %: 1 % (ref 0–3)
EOSINOPHIL #: 0.1 x10ˆ3/uL (ref 0.00–0.50)
EOSINOPHIL %: 3 % (ref 0–5)
HCT: 36.1 % (ref 36.0–45.0)
HCT: 36.1 % (ref 36.0–45.0)
HGB: 11.8 g/dL — ABNORMAL LOW (ref 12.0–15.5)
LYMPHOCYTE #: 1.1 x10ˆ3/uL (ref 1.00–4.80)
LYMPHOCYTE %: 26 % (ref 15–43)
MCH: 31.8 pg (ref 27.5–33.2)
MCHC: 32.8 g/dL (ref 32.0–36.0)
MCV: 96.8 fL (ref 82.0–97.0)
MONOCYTE #: 0.4 x10ˆ3/uL (ref 0.20–0.90)
MONOCYTE %: 9 % (ref 5–12)
MPV: 10.2 fL (ref 7.4–10.5)
NEUTROPHIL #: 2.7 x10ˆ3/uL (ref 1.50–6.50)
NEUTROPHIL %: 62 % (ref 43–76)
PLATELETS: 121 x10ˆ3/uL — ABNORMAL LOW (ref 150–450)
RBC: 3.73 x10ˆ6/uL — ABNORMAL LOW (ref 4.00–5.10)
RDW: 14 % (ref 11.0–16.0)
WBC: 4.4 x10ˆ3/uL (ref 4.0–11.0)

## 2016-07-29 LAB — LIPASE: LIPASE: 371 U/L — ABNORMAL HIGH (ref 22–51)

## 2016-07-29 LAB — COMPREHENSIVE METABOLIC PROFILE - BMC/JMC ONLY
ALBUMIN/GLOBULIN RATIO: 1.2 (ref 0.8–2.0)
ALBUMIN: 2.6 g/dL — ABNORMAL LOW (ref 3.5–5.0)
ALKALINE PHOSPHATASE: 66 U/L (ref 38–126)
ANION GAP: 7 mmol/L (ref 3–11)
AST (SGOT): 28 U/L (ref 15–41)
BILIRUBIN TOTAL: 0.6 mg/dL (ref 0.3–1.2)
BUN/CREA RATIO: 7 (ref 6–22)
BUN: 5 mg/dL — ABNORMAL LOW (ref 6–20)
CALCIUM: 8.6 mg/dL (ref 8.6–10.3)
CHLORIDE: 109 mmol/L (ref 101–111)
CO2 TOTAL: 28 mmol/L (ref 22–32)
CREATININE: 0.71 mg/dL (ref 0.44–1.00)
ESTIMATED GFR: >60 mL/min/1.73mˆ2 (ref >60)
GLUCOSE: 101 mg/dL (ref 70–110)
POTASSIUM: 3.7 mmol/L (ref 3.4–5.1)
PROTEIN TOTAL: 4.8 g/dL — ABNORMAL LOW (ref 6.4–8.3)
SODIUM: 144 mmol/L (ref 136–145)

## 2016-07-29 LAB — POC FINGERSTICK GLUCOSE - BMC/JMC (RESULTS)
GLUCOSE, POC: 92 mg/dL (ref 60–100)
GLUCOSE, POC: 90 mg/dL (ref 60–100)
GLUCOSE, POC: 121 mg/dL — ABNORMAL HIGH (ref 60–100)

## 2016-07-29 LAB — TYPE AND SCREEN
ABO/RH(D): A POS
ANTIBODY SCREEN: NEGATIVE

## 2016-07-29 LAB — MAGNESIUM: MAGNESIUM: 1.7 mg/dL (ref 1.4–2.1)

## 2016-07-29 LAB — AMYLASE: AMYLASE: 220 U/L — ABNORMAL HIGH (ref 5–100)

## 2016-07-29 LAB — LACTIC ACID LEVEL: LACTIC ACID: 0.9 mmol/L (ref 0.5–2.0)

## 2016-07-29 MED ORDER — OXYCODONE-ACETAMINOPHEN 5 MG-325 MG TABLET
1.00 | ORAL_TABLET | ORAL | Status: DC | PRN
Start: 2016-07-29 — End: 2016-07-29
  Administered 2016-07-29: 1 via ORAL
  Filled 2016-07-29: qty 1

## 2016-07-29 MED ORDER — MAGNESIUM SULFATE 4 MEQ/ML (50 %) INJECTION SOLUTION
INTRAMUSCULAR | Status: AC
Start: 2016-07-29 — End: 2016-07-30
  Filled 2016-07-29: qty 2000

## 2016-07-29 MED ORDER — FAT EMULSION 20 % IV (SCHEDULED, NO OVERFILL)
30.0000 g | Freq: Once | INTRAVENOUS | Status: DC
Start: 2016-07-30 — End: 2016-07-31
  Administered 2016-07-30: 0 kcal via INTRAVENOUS
  Filled 2016-07-29: qty 250

## 2016-07-29 MED ORDER — BACITRACIN 500 UNIT/GRAM TOPICAL PACKET
1.00 | PACK | CUTANEOUS | Status: AC
Start: 2016-07-29 — End: 2016-07-29

## 2016-07-29 MED ORDER — LIDOCAINE HCL 10 MG/ML (1 %) INJECTION SOLUTION
2.0000 mL | INTRAMUSCULAR | Status: AC
Start: 2016-07-29 — End: 2016-07-29
  Administered 2016-07-29: 2 mL via INTRADERMAL
  Filled 2016-07-29: qty 20

## 2016-07-29 MED ORDER — BACITRACIN 500 UNIT/GRAM TOPICAL PACKET
PACK | CUTANEOUS | Status: AC
Start: 2016-07-29 — End: 2016-07-29
  Administered 2016-07-29: 1 via TOPICAL
  Filled 2016-07-29: qty 1

## 2016-07-29 MED ORDER — LORAZEPAM 2 MG/ML INJECTION SOLUTION
1.00 mg | INTRAMUSCULAR | Status: AC
Start: 2016-07-29 — End: 2016-07-29
  Administered 2016-07-29: 1 mg via INTRAMUSCULAR
  Filled 2016-07-29: qty 1

## 2016-07-29 MED ORDER — BUPRENORPHINE HCL 2 MG SUBLINGUAL TABLET
8.0000 mg | SUBLINGUAL_TABLET | Freq: Every day | SUBLINGUAL | Status: DC
Start: 2016-07-29 — End: 2016-07-31
  Administered 2016-07-29 – 2016-07-31 (×3): 8 mg via SUBLINGUAL
  Filled 2016-07-29 (×7): qty 4

## 2016-07-29 MED ORDER — DIPHENHYDRAMINE 12.5 MG/5 ML ORAL LIQUID
12.50 mg | Freq: Four times a day (QID) | ORAL | Status: DC | PRN
Start: 2016-07-29 — End: 2016-07-30
  Administered 2016-07-29 (×2): 12.5 mg via ORAL
  Filled 2016-07-29 (×2): qty 5

## 2016-07-29 MED ORDER — HYDROMORPHONE 2 MG/ML INJECTION SYRINGE
2.0000 mg | INJECTION | Freq: Once | INTRAMUSCULAR | Status: AC
Start: 2016-07-29 — End: 2016-07-29
  Administered 2016-07-29: 2 mg via INTRAVENOUS
  Filled 2016-07-29: qty 1

## 2016-07-29 MED ORDER — RANITIDINE 50 MG/2 ML (25 MG/ML) INJECTION SOLUTION
50.00 mg | Freq: Three times a day (TID) | INTRAMUSCULAR | Status: DC
Start: 2016-07-29 — End: 2016-07-31
  Administered 2016-07-29 – 2016-07-30 (×5): 50 mg via INTRAVENOUS
  Filled 2016-07-29 (×13): qty 2

## 2016-07-29 MED ORDER — LORAZEPAM 2 MG/ML INJECTION SOLUTION
1.00 mg | INTRAMUSCULAR | Status: AC
Start: 2016-07-29 — End: 2016-07-29
  Administered 2016-07-29: 1 mg via INTRAVENOUS

## 2016-07-29 MED ORDER — BUPRENORPHINE HCL 2 MG SUBLINGUAL TABLET
8.0000 mg | SUBLINGUAL_TABLET | Freq: Every day | SUBLINGUAL | Status: DC
Start: 2016-07-29 — End: 2016-07-29

## 2016-07-29 MED ADMIN — Medication: INTRAVENOUS | @ 17:00:00

## 2016-07-29 MED ADMIN — HYDROcodone 5 mg-acetaminophen 325 mg tablet: INTRAVENOUS | @ 08:00:00

## 2016-07-29 MED ADMIN — oxyCODONE 5 mg tablet: INTRAVENOUS | @ 01:00:00

## 2016-07-29 NOTE — Progress Notes (Signed)
Pt refused to have biliary scan done at this time due to multiple reasons.  We will try to do test tomorrow.  Please keep patient NPO and no morphine or dilaudid after 4AM.  RN notified.

## 2016-07-29 NOTE — Progress Notes (Signed)
[facility]  [facility]  [place], [place] [id number]    IP PROGRESS NOTE      [name] CrutchShatzer,Moraima  Date of Admission:  07/27/2016  Date of Birth:  06/13/1991  Date of Service:  07/29/2016    Chief Complaint: epigastric pain radiating to back persists. Has a new rash and swelling over the right forearm and arm, per staff iv had infiltrated.  Subjective: was unable to schedule HIDA scan this morning since she was on tpn at night. Subsequently developed bleeding and pain around subclavian line, so a PICC was placed. In the interim she refuses the HIDA scan citing that her family is visiting from 2 hours away. She was explained the consequences of refusing which included but were not limited to delay in treatment, disability, coma death and she still refuses the procedure.    Vital Signs:  Temp (24hrs) Max:37 C (98.6 F)      Temperature: 37 C (98.6 F)  BP (Non-Invasive): (!) 149/88  MAP (Non-Invasive): 102 mmHG  Heart Rate: 44  Respiratory Rate: 16  Pain Score (Numeric, Faces): 10  SpO2-1: 99 %    Current Medications:    Current Facility-Administered Medications:  adult custom parenteral nutrition with CLINIMIX  Intravenous Continuous   adult custom parenteral nutrition with CLINIMIX  Intravenous Continuous   diatrizoate meglumine & sodium (MD-GASTROVIEW) 15 mL in 500 mL SW oral solution 500 mL Oral Give in Radiology   diphenhydrAMINE (BENADRYL) 12.5mg  per 5mL oral liquid (alcohol free) 12.5 mg Oral Q6H PRN   enoxaparin (LOVENOX) 40 mg/0.4 mL SubQ injection 40 mg Subcutaneous Daily   fat emulsion (INTRALIPID) 20% infusion 30 g Intravenous Once   [START ON 07/30/2016] fat emulsion (INTRALIPID) 20% infusion 30 g Intravenous Once   heparin flush (HEPFLUSH) 10 units/mL injection 2 mL Intracatheter Q12H   HYDROmorphone (DILAUDID) 2 mg/mL injection 1 mg Intravenous Q3H PRN   LORazepam (ATIVAN) 2 mg/mL injection 1 mg Intravenous Q6H PRN   LORazepam (ATIVAN) 2 mg/mL injection 1 mg Intravenous Q4H PRN   LR premix  infusion  Intravenous Continuous   nicotine (NICODERM CQ) transdermal patch (mg/24 hr) 21 mg Transdermal Daily   nitroGLYCERIN (NITROSTAT) sublingual tablet 0.4 mg Sublingual Q5 Min PRN   NS flush syringe 10 mL Intravenous Q8HRS   NS flush syringe 10 mL Intravenous Q1H PRN   ondansetron (ZOFRAN) 2 mg/mL injection 4 mg Intravenous Q4H PRN   oxyCODONE-acetaminophen (PERCOCET) 5-325mg  per tablet 1 Tab Oral Q4H PRN   Pharmacy TPN Consult  Intravenous Daily PRN   raNITIdine (ZANTAC) 25 mg/mL injection 50 mg Intravenous Q8H       Today's Physical Exam:    GENERAL: Patient is alert, awake and oriented X 3. In no cardio respiratory distress.   HEENT: PERRLA, EOMI.   NECK: Supple, NO JVD.   HEART: S1+, S2+, rate controlled and rhythm regular. No murmurs appreciated.   LUNGS: Clear to auscultation bilaterally.   ABDOMEN: Soft, epigastric tenderness, no guarding, ND with NABS.   EXTREMITIES: No edema, pedal pulses palpable. Rt arm forearm, edematous red, tender, pruritic rash coalescing.  NEURO: Cranial nerves 2 to 12 grossly intact, Motor exam is non focal.   SKIN: No rashes or bruises.   PSYCH: depressed      I/O:  I/O last 24 hours:      Intake/Output Summary (Last 24 hours) at 07/29/16 1530  Last data filed at 07/29/16 0824   Gross per 24 hour   Intake  1282 ml   Output             2700 ml   Net            -1418 ml     I/O current shift:  09/28 0800 - 09/28 1559  In: -   Out: 100 [Urine:100]    Nutrition/Residuals:  DIET NPO - NOW STRICT, EXCEPT ALL MEDS WITH SIPS OF WATER  adult custom parenteral nutrition with CLINIMIX  adult custom parenteral nutrition with CLINIMIX    Labs    Reviewed:   Lab Results for Last 24 Hours:    Results for orders placed or performed during the hospital encounter of 07/27/16 (from the past 24 hour(s))   POC FINGERSTICK GLUCOSE - [facility] (RESULTS)   Result Value Ref Range    GLUCOSE, POC 84 60 - 100 mg/dL   POC FINGERSTICK GLUCOSE - [facility] (RESULTS)   Result Value Ref Range     GLUCOSE, POC 79 60 - 100 mg/dL   RENAL FUNCTION PANEL - TPN DAY 2   Result Value Ref Range    SODIUM 144 136 - 145 mmol/L    POTASSIUM 3.7 3.4 - 5.1 mmol/L    CHLORIDE 109 101 - 111 mmol/L    CO2 TOTAL 28 22 - 32 mmol/L    ANION GAP 7 3 - 11 mmol/L    BUN 5 (L) 6 - 20 mg/dL    CREATININE 5.36 6.44 - 1.00 mg/dL    BUN/CREA RATIO 7 6 - 22    ESTIMATED GFR >60 >60 mL/min/1.49m2    CALCIUM 8.6 8.6 - 10.3 mg/dL    GLUCOSE 034 70 - 742 mg/dL    PHOSPHORUS 4.8 (H) 2.7 - 4.5 mg/dL    ALBUMIN 2.6 (L) 3.5 - 5.0 g/dL   MAGNESIUM - TPN DAY 2   Result Value Ref Range    MAGNESIUM 1.7 1.4 - 2.1 mg/dL   COMPREHENSIVE METABOLIC PROFILE - [facility] ONLY   Result Value Ref Range    SODIUM 144 136 - 145 mmol/L    POTASSIUM 3.7 3.4 - 5.1 mmol/L    CHLORIDE 109 101 - 111 mmol/L    CO2 TOTAL 28 22 - 32 mmol/L    ANION GAP 7 3 - 11 mmol/L    BUN 5 (L) 6 - 20 mg/dL    CREATININE 5.95 6.38 - 1.00 mg/dL    BUN/CREA RATIO 7 6 - 22    ESTIMATED GFR >60 >60 mL/min/1.17m2    ALBUMIN 2.6 (L) 3.5 - 5.0 g/dL    CALCIUM 8.6 8.6 - 75.6 mg/dL    GLUCOSE 433 70 - 295 mg/dL    ALKALINE PHOSPHATASE 66 38 - 126 U/L    ALT (SGPT) 17 14 - 54 U/L    AST (SGOT) 28 15 - 41 U/L    BILIRUBIN TOTAL 0.6 0.3 - 1.2 mg/dL    PROTEIN TOTAL 4.8 (L) 6.4 - 8.3 g/dL    ALBUMIN/GLOBULIN RATIO 1.2 0.8 - 2.0   AMYLASE   Result Value Ref Range    AMYLASE 220 (H) 5 - 100 U/L   LIPASE   Result Value Ref Range    LIPASE 371 (H) 22 - 51 U/L   CBC WITH DIFF   Result Value Ref Range    WBC 3.8 (L) 4.0 - 11.0 x103/uL    RBC 3.37 (L) 4.00 - 5.10 x106/uL    HGB 10.9 (L) 12.0 - 15.5 g/dL    HCT 18.8 (L)  36.0 - 45.0 %    MCV 96.8 82.0 - 97.0 fL    MCH 32.3 27.5 - 33.2 pg    MCHC 33.4 32.0 - 36.0 g/dL    RDW 65.7 84.6 - 96.2 %    PLATELETS 110 (L) 150 - 450 x103/uL    MPV 10.6 (H) 7.4 - 10.5 fL    NEUTROPHIL % 52 43 - 76 %    LYMPHOCYTE % 35 15 - 43 %    MONOCYTE % 9 5 - 12 %    EOSINOPHIL % 4 0 - 5 %    BASOPHIL % 0 0 - 3 %    NEUTROPHIL # 2.00 1.50 - 6.50 x103/uL    LYMPHOCYTE #  1.30 1.00 - 4.80 x103/uL    MONOCYTE # 0.30 0.20 - 0.90 x103/uL    EOSINOPHIL # 0.10 0.00 - 0.50 x103/uL    BASOPHIL # 0.00 0.00 - 0.10 x103/uL   POC FINGERSTICK GLUCOSE - [facility] (RESULTS)   Result Value Ref Range    GLUCOSE, POC 92 60 - 100 mg/dL   POC FINGERSTICK GLUCOSE - [facility] (RESULTS)   Result Value Ref Range    GLUCOSE, POC 90 60 - 100 mg/dL         Diagnostic Tests   Reviewed    Problem List:  Active Hospital Problems   (*Primary Problem)    Diagnosis    *Acute alcoholic pancreatitis    Primary thrombocytopenia       Assessment/ Plan:  Acute epigastric pain-suspected acalculous cholecystitis vs pancreatitis. Worsening. HIDA scan refused by pt Awaitng surgery recomnedations. Will ct npo, hold tpn to obtain HIDA scan in the morning if pt is agreeable.Pt. Likely has penicillin allergy. Will consult ID for change of antibiotics.    Allergic reaction- to PCN hold PCN , consult ID for further antibiotics. Zantac, prn bendaryl. Consider steroids if worsens but will hold off currently given the suspicion of cholecystis    Subclavian line- bleeding, Position confirmed. Will obtain PICC line for access and remove subclavian line.    Alcohol Abuse: will need crisis worker consult when acute issues resolve.  IV Ativan PRN for alcohol withdrawal.     Thrombocytopenia: alcohol related marrow suppression, no bleeding, monitor counts.    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 Will  resume this .     Hx of Hep C- monitor LFT.     Tobacco Use Disorder: counseled on cessation. Nicoderm ordered.     DVT PPx: Lovenox  Code Status:  Full Code  Jewel Baize, MD 07/29/2016, 15:30

## 2016-07-29 NOTE — Nurses Notes (Signed)
Patient agitated this evening, has been trying to contact fiance all day. Asked if we could call him, attempted but no contact was made. Patient found out that her fiance was in jail, had her keys and her car in his possession. Patient agitated/crying because her dog is locked in the house. Brother came to visit, provided patient with food because she was so hungry. Printed hand out provided to patient on pancreatitis.

## 2016-07-29 NOTE — Nurses Notes (Signed)
Shift report /received by Susana Colvin, RN with opportunity to ask questions and clarify issues.

## 2016-07-29 NOTE — Progress Notes (Signed)
[facility]  GI Consult  Follow Up Note      [name] Frost,[name], 25 y.o. female  Date of Service: 07/29/2016  Date of Birth:  02/14/1991    Hospital Day:  LOS: 2 days     Chief Complaint:  Pancreatitis/?cholecystitis  Subjective: CONTINUES WITH UPPER ABDOMINAL PAIN    Objective:  Temperature: 36.9 C (98.5 F)  Heart Rate: 62  BP (Non-Invasive): 139/84  Respiratory Rate: 16  SpO2-1: 100 %  Pain Score (Numeric, Faces): 9  Constitutional:  mild distress  Respiratory:  Clear to auscultation bilaterally. , ANTERIORLY  Cardiovascular:  regular rate and rhythm  Gastrointestinal:  TENDER UPPER ABDOMEN NO REBOUND OR GUARDING  Neurologic:  Grossly normal    Labs:  I have reviewed all lab results.    Imaging Studies:  ct scan abd/pelvis reviewed    Assessment/Recommendations:  FOR HIDA SCAN/SURGICAL CONSULT TO EVALUATE GB FINDINGS ON CT SCAN.CONTINUE NPO AND TPN FOR PANCREATITIS.  [name] Chessimothy Macaiah Mangal, MD

## 2016-07-29 NOTE — Care Plan (Signed)
Problem: Pancreatitis, Acute/Chronic (Adult)  Prevent and manage potential problems including: 1. diarrhea 2. fluid imbalance 3. hemorrhage 4. hypercatabolism 5. hyperglycemia 6. hypoxia/hypoxemia 7. infection 8. malabsorption/maldigestion 9. nausea and vomiting 10. pain 11. postoperative ileus 12. situational response  Goal: Signs and Symptoms of Listed Potential Problems Will be Absent, Minimized or Managed (Pancreatitis, Acute/Chronic)  Signs and symptoms of listed potential problems will be absent, minimized or managed by discharge/transition of care (reference Pancreatitis, Acute/Chronic (Adult) CPG).  Outcome: Ongoing (see interventions/notes)

## 2016-07-29 NOTE — Care Plan (Signed)
Problem: Patient Care Overview (Adult,OB)  Goal: Plan of Care Review(Adult,OB)  The patient and/or their representative will communicate an understanding of their plan of care   Outcome: Ongoing (see interventions/notes)    Problem: Pain, Acute (Adult)  Goal: Acceptable Pain Control/Comfort Level  Patient will demonstrate the desired outcomes by discharge/transition of care.   Outcome: Ongoing (see interventions/notes)    Problem: Fall Risk (Adult)  Goal: Absence of Falls  Patient will demonstrate the desired outcomes by discharge/transition of care.   Outcome: Ongoing (see interventions/notes)    Comments:   Care Plan Note     Medications and plan of care reviewed with patient who verbalized understanding. Pain to abdomen described as 9/10, Prn ordered Dilaudid administered. Patient is currently sleeping. Medium Fall risk per [facility] assessment, Patient has been agitated and impulsive and not calling for help. Bed alarm refused. Walks with a steady gait. Room near nurses station, patient currently sleeping.        Levonne HubertLindsey Jaedin Trumbo, RN

## 2016-07-29 NOTE — Nurses Notes (Signed)
Patient to IR from 531, A via wheelchair for PICC line placement. Patient is A&Ox3. See Xper for full report.

## 2016-07-29 NOTE — Pharmacy (Signed)
TPN Therapy Follow Up  07/29/2016       Bellot,Kristian  Date of Birth:  07/20/1991    TPN Base: (Clinimix) amino acids 4.25% infusion in D5W  Lipids: yes  Central Line: yes  Rate: 70 mL/hr    TPN Order Summary      The values shown are based on the patient receiving 0.81 bags over 24 hours.           Range     Amino Acids (g) 68.85 --     Dextrose (g) 81 --     Dextrose Concentration (%) 4.85 --     Volume 1,671.19 --     Infusion Rate (mL/hr) 70      Infusion Site Central      Multi-vitamins (mL) 8.1 --     Trace Elements (mL) 0.81 --     Weight Used 57 kg         Caloric Contribution          kcal/kg kcal %     Protein 4.83 275.4 50     Dextrose 4.83 275.4 50     Lipids -- -- --     Total 9.66 550.8                Electrolytes      Cations Amount Range     Sodium (mEq) 8.1 --     Potassium (mEq) 56.7 --     Calcium (mEq) 4.07 --     Magnesium (mEq) 12.64 --     Anions           Phosphate (mmol) -- --     Chloride (mEq) 76.14 --     Acetate (mEq) 76.14 --        Mixture Compatibility            Range     Calcium Phosphate Solubility Curve (mEq/L of Calcium) -- --     Osmolarity 751.86 --     Calcium: Phosphate Ratio (mEq: mmol) -- --                         Comments: Increased rate to 6770ml/h    Pharmacy will continue to follow Johnna Mahabir's TPN therapy.  Please contact the pharmacy with any questions.    Wilmer Floorammy Fin Hupp, [facility]  07/29/2016 07:58

## 2016-07-29 NOTE — Care Plan (Signed)
Problem: Patient Care Overview (Adult,OB)  Goal: Plan of Care Review(Adult,OB)  The patient and/or their representative will communicate an understanding of their plan of care   Outcome: Ongoing (see interventions/notes)    07/29/16 0710   Coping/Psychosocial   Plan Of Care Reviewed With patient         Problem: Depression (Adult,Obstetrics,Pediatric)  Goal: Identify Related Risk Factors and Signs and Symptoms  Related risk factors and signs and symptoms are identified upon initiation of Human Response Clinical Practice Guideline (CPG).   Outcome: Ongoing (see interventions/notes)    Problem: Pain, Acute (Adult)  Goal: Identify Related Risk Factors and Signs and Symptoms  Related risk factors and signs and symptoms are identified upon initiation of Human Response Clinical Practice Guideline (CPG).   Outcome: Ongoing (see interventions/notes)    Problem: Pancreatitis, Acute/Chronic (Adult)  Prevent and manage potential problems including: 1. diarrhea 2. fluid imbalance 3. hemorrhage 4. hypercatabolism 5. hyperglycemia 6. hypoxia/hypoxemia 7. infection 8. malabsorption/maldigestion 9. nausea and vomiting 10. pain 11. postoperative ileus 12. situational response   Goal: Signs and Symptoms of Listed Potential Problems Will be Absent, Minimized or Managed (Pancreatitis, Acute/Chronic)  Signs and symptoms of listed potential problems will be absent, minimized or managed by discharge/transition of care (reference Pancreatitis, Acute/Chronic (Adult) CPG).   Outcome: Ongoing (see interventions/notes)    07/29/16 0710   Pancreatitis, Acute/Chronic   Problems Assessed (Pancreatitis) pain         Comments:   Patient verbalized understanding, no needs at this time, call light within reach, assuming care at this time, will continue to monitor.

## 2016-07-29 NOTE — Nurses Notes (Signed)
Patient extremely anxious and tearful.  Patient told this nurse she was just informed her fiance was just sentenced to 2 to 3 years in jail and not sure what she was going to do because he was her sole provider and her car was in his name and she doesn't know how to get her house keys, or bank card and has no idea where her car is. Notified Dr Ann HeldBagree received a one time dose of ativan.

## 2016-07-29 NOTE — Nurses Notes (Signed)
Pt C/O rash, redness, and irritation on arms.   Assisted pt in washing arms.   Applied lotion and protective ointment.   Propped pt's arms up on pillow for comfort.   Pt resting in bed.   IV ativan administered due to pt's anxiousness.     PT REFUSING BED ALARM.

## 2016-07-29 NOTE — Nurses Notes (Signed)
Patient's central line leaking, spoke with IR they are unable to assess because they did no place the line.  Patient complains of pain at the site notified Dr Ann HeldBagree, chest XRay ordered.  New orders received to removed line and have PICC placed and Central line removed. Tasia Catchingsraig in IR told this nurse to hold off on removing the line until the picc is placed and biliary test is complete.

## 2016-07-29 NOTE — Progress Notes (Signed)
[facility]  [facility]  [address]  [place], [place] [address]     [phone]     Surgical Consult      Patient Name: [name]  Patient MRN: [id number]  Patient DOB: Jun 28, 1991  Date of Service:07/29/2016    Requesting Physician: Ann Held    CC:   Chief Complaint   Patient presents with    Abdominal Pain       HPI: 25 yo female admitted for a 2 year history of epigastric abdominal pain that has acutely worsened in the past week.  She was admitted and treated for acute pancreatitis and left AMA. She returned to continue treatment and has ongoing pain.  She had a MRCP with moderate pancreatitis 5 days ago.  Her pancreatic enzymes remain elevated but are slowly down trending.  A RUQ u/s was without stones and without cholecystitis.  She has Hep c. She admits to drinking 2-3 gallons of wine per day.     Past Medical History:   Diagnosis Date    ETOH abuse     Hepatitis C     Heroin abuse          Past Surgical History:   Procedure Laterality Date    HX TONSILLECTOMY           Social History     Social History    Marital status: Single     Spouse name: N/A    Number of children: N/A    Years of education: N/A     Occupational History    Not on file.     Social History Main Topics    Smoking status: Current Every Day Smoker     Packs/day: 1.00     Types: Cigarettes    Smokeless tobacco: Not on file    Alcohol use Yes      Comment: daily 2 large bottles of wine    Drug use: No      Comment: hx heroin    Sexual activity: Not on file     Other Topics Concern    Not on file     Social History Narrative    No narrative on file     Family Medical History     None              (Not in an outpatient encounter)  No Known Allergies    ROS:   General- + 20 lb recent weight loss, no night sweats  HEENT- No recent vision or hearing loss  CV- No chest pain or palpitations  Resp- No dyspnea or cough  GI-  +epigastric abd pain, Normal bowel pattern. ?black stools  GU-  No hematuria/dysuria  MS- No weakness or fatigue    Vitals:    07/29/16 0823 07/29/16 1217 07/29/16 1638 07/29/16 2145   BP: (!) 144/91 (!) 149/88 (!) 124/49 (!) 158/96   Pulse: 43 44 53 55   Resp: 16 16 16 16    Temp: 36.9 C (98.5 F) 37 C (98.6 F)  37.3 C (99.2 F)   SpO2: 99% 99% 95% 100%   Weight:       Height:           Physical Exam:   Gen- Well appearing, no acute distress. Walking around in room   HEENT- PERRL, EOMI  Abd- Soft, minimal epigastric tenderness. Negative Murphy. no rebound/gaurding  Rectal- deferred  Has left sided subclavian central line  Skin- normal appearing, no  rashes  Psych- appropriate, pleasant  Neuro- CAOx3, exam nonfocal    Labs and Imaging Reviewed.     Labs:  CBC:    Lab Results   Component Value Date    WBC 4.4 07/29/2016    HGB 11.8 (L) 07/29/2016    HCT 36.1 07/29/2016    PLTCNT 121 (L) 07/29/2016     CMP:  Lab Results   Component Value Date/Time    SODIUM 144 07/29/2016 05:50 AM    SODIUM 144 07/29/2016 05:50 AM    POTASSIUM 3.7 07/29/2016 05:50 AM    POTASSIUM 3.7 07/29/2016 05:50 AM    CHLORIDE 109 07/29/2016 05:50 AM    CHLORIDE 109 07/29/2016 05:50 AM    CO2 28 07/29/2016 05:50 AM    CO2 28 07/29/2016 05:50 AM    BUN 5 (L) 07/29/2016 05:50 AM    BUN 5 (L) 07/29/2016 05:50 AM    CREATININE 0.71 07/29/2016 05:50 AM    CREATININE 0.71 07/29/2016 05:50 AM    GFR >60 07/29/2016 05:50 AM    GFR >60 07/29/2016 05:50 AM    CALCIUM 8.6 07/29/2016 05:50 AM    CALCIUM 8.6 07/29/2016 05:50 AM    MAGNESIUM 1.7 07/29/2016 05:50 AM    PHOSPHORUS 4.8 (H) 07/29/2016 05:50 AM    ALBUMIN 2.6 (L) 07/29/2016 05:50 AM    ALBUMIN 2.6 (L) 07/29/2016 05:50 AM    ALKPHOS 66 07/29/2016 05:50 AM    AST 28 07/29/2016 05:50 AM    ALT 17 07/29/2016 05:50 AM    AMYLASE 220 (H) 07/29/2016 05:50 AM    LIPASE 371 (H) 07/29/2016 05:50 AM     Coags:    Lab Results   Component Value Date    PROTHROMTME 13.0 (H) 07/28/2016    INR 1.18 07/28/2016    APTT 26.3 07/23/2016     Pregnancy test: Lab Results      Component Value Date    PREGNANCURED Negative 07/23/2016       ASSESSMENT:  25 y.o. White female with acute pancreatitis, chronic abdominal pain    PLAN:  1. No indication for acute surgical intervention. No stones on MRI/US and therefore no indication for cholecystectomy following pancreatitis  2. She refused HIDA today. If agreeable can be attempted tomorrow- very low suspicion for cholecystitis.       Duanne MoronJason L Amzie Sillas, MD       Cc: Ruffin FrederickJoy Ramsay Farah, MD

## 2016-07-30 ENCOUNTER — Inpatient Hospital Stay (HOSPITAL_BASED_OUTPATIENT_CLINIC_OR_DEPARTMENT_OTHER): Payer: Self-pay

## 2016-07-30 LAB — RENAL FUNCTION PANEL
ALBUMIN: 3.2 g/dL — ABNORMAL LOW (ref 3.5–5.0)
ANION GAP: 6 mmol/L (ref 3–11)
BUN/CREA RATIO: 12 (ref 6–22)
BUN/CREA RATIO: 12 (ref 6–22)
BUN: 8 mg/dL (ref 6–20)
CALCIUM: 9.4 mg/dL (ref 8.6–10.3)
CHLORIDE: 108 mmol/L (ref 101–111)
CO2 TOTAL: 28 mmol/L (ref 22–32)
CREATININE: 0.67 mg/dL (ref 0.44–1.00)
ESTIMATED GFR: >60 mL/min/1.73mˆ2 (ref >60)
GLUCOSE: 105 mg/dL (ref 70–110)
PHOSPHORUS: 4.6 mg/dL — ABNORMAL HIGH (ref 2.7–4.5)
POTASSIUM: 3.9 mmol/L (ref 3.4–5.1)
SODIUM: 142 mmol/L (ref 136–145)

## 2016-07-30 LAB — CBC WITH DIFF
BASOPHIL %: 1 % (ref 0–3)
EOSINOPHIL %: 4 % (ref 0–5)
HCT: 32.4 % — ABNORMAL LOW (ref 36.0–45.0)
HGB: 10.7 g/dL — ABNORMAL LOW (ref 12.0–15.5)
LYMPHOCYTE #: 1.1 x10ˆ3/uL (ref 1.00–4.80)
LYMPHOCYTE %: 25 % (ref 15–43)
MCH: 31.7 pg (ref 27.5–33.2)
MCHC: 32.8 g/dL (ref 32.0–36.0)
MCV: 96.7 fL (ref 82.0–97.0)
MONOCYTE #: 0.5 x10ˆ3/uL (ref 0.20–0.90)
MONOCYTE %: 11 % (ref 5–12)
MPV: 10 fL (ref 7.4–10.5)
NEUTROPHIL #: 2.7 x10ˆ3/uL (ref 1.50–6.50)
NEUTROPHIL %: 60 % (ref 43–76)
RBC: 3.36 x10ˆ6/uL — ABNORMAL LOW (ref 4.00–5.10)
RDW: 14.1 % (ref 11.0–16.0)
WBC: 4.5 x10ˆ3/uL (ref 4.0–11.0)

## 2016-07-30 LAB — COMPREHENSIVE METABOLIC PROFILE - BMC/JMC ONLY
ALBUMIN/GLOBULIN RATIO: 1.3 (ref 0.8–2.0)
ALBUMIN: 3.2 g/dL — ABNORMAL LOW (ref 3.5–5.0)
ANION GAP: 6 mmol/L (ref 3–11)
BILIRUBIN TOTAL: 0.7 mg/dL (ref 0.3–1.2)
BUN/CREA RATIO: 12 (ref 6–22)
CALCIUM: 9.4 mg/dL (ref 8.6–10.3)
CHLORIDE: 108 mmol/L (ref 101–111)
CREATININE: 0.67 mg/dL (ref 0.44–1.00)
ESTIMATED GFR: >60 mL/min/1.73mˆ2 (ref >60)
POTASSIUM: 3.9 mmol/L (ref 3.4–5.1)
PROTEIN TOTAL: 5.7 g/dL — ABNORMAL LOW (ref 6.4–8.3)
SODIUM: 142 mmol/L (ref 136–145)

## 2016-07-30 LAB — POC FINGERSTICK GLUCOSE - BMC/JMC (RESULTS)
GLUCOSE, POC: 112 mg/dL — ABNORMAL HIGH (ref 60–100)
GLUCOSE, POC: 79 mg/dL (ref 60–100)
GLUCOSE, POC: 90 mg/dL (ref 60–100)

## 2016-07-30 LAB — AMYLASE: AMYLASE: 182 U/L — ABNORMAL HIGH (ref 5–100)

## 2016-07-30 LAB — MAGNESIUM: MAGNESIUM: 1.8 mg/dL (ref 1.4–2.1)

## 2016-07-30 LAB — PREALBUMIN: PREALBUMIN: 19.1 mg/dL (ref 18.0–38.0)

## 2016-07-30 LAB — LIPASE: LIPASE: 361 U/L — ABNORMAL HIGH (ref 22–51)

## 2016-07-30 MED ORDER — DIPHENHYDRAMINE-ZINC ACETATE 1 %-0.1 % TOPICAL CREAM
TOPICAL_CREAM | Freq: Four times a day (QID) | CUTANEOUS | Status: DC | PRN
Start: 2016-07-30 — End: 2016-07-31
  Filled 2016-07-30: qty 28.3

## 2016-07-30 MED ORDER — ACETAMINOPHEN 325 MG TABLET
650.00 mg | ORAL_TABLET | ORAL | Status: DC | PRN
Start: 2016-07-30 — End: 2016-07-31
  Administered 2016-07-30 – 2016-07-31 (×4): 650 mg via ORAL
  Filled 2016-07-30 (×4): qty 2

## 2016-07-30 MED ORDER — AMINO ACID 4.25 % IN DEXTROSE 5 % INTRAVENOUS SOLUTION
INTRAVENOUS | Status: DC
Start: 2016-07-30 — End: 2016-07-31
  Filled 2016-07-30: qty 2000

## 2016-07-30 MED ORDER — DIPHENHYDRAMINE 50 MG CAPSULE
50.00 mg | ORAL_CAPSULE | Freq: Four times a day (QID) | ORAL | Status: DC | PRN
Start: 2016-07-30 — End: 2016-07-31
  Administered 2016-07-30 – 2016-07-31 (×5): 50 mg via ORAL
  Filled 2016-07-30 (×6): qty 1

## 2016-07-30 MED ORDER — HYDROMORPHONE 2 MG/ML INJECTION SYRINGE
2.00 mg | INJECTION | INTRAMUSCULAR | Status: DC | PRN
Start: 2016-07-30 — End: 2016-07-31
  Administered 2016-07-30 (×6): 2 mg via INTRAVENOUS
  Filled 2016-07-30 (×6): qty 1

## 2016-07-30 MED ORDER — FAT EMULSION 20 % IV (SCHEDULED, NO OVERFILL)
30.0000 g | Freq: Once | INTRAVENOUS | Status: DC
Start: 2016-07-31 — End: 2016-07-31
  Administered 2016-07-31: 0 kcal via INTRAVENOUS
  Filled 2016-07-30: qty 250

## 2016-07-30 MED ORDER — LACTATED RINGERS INTRAVENOUS SOLUTION
INTRAVENOUS | Status: DC
Start: 2016-07-30 — End: 2016-07-31

## 2016-07-30 MED ADMIN — ondansetron HCl (PF) 4 mg/2 mL injection solution: INTRAVENOUS | @ 18:00:00

## 2016-07-30 MED ADMIN — diphenhydrAMINE 50 mg capsule: ORAL | @ 22:00:00

## 2016-07-30 MED ADMIN — lactated Ringers intravenous solution: INTRAVENOUS | @ 13:00:00

## 2016-07-30 MED ADMIN — ipratropium 0.5 mg-albuteroL 3 mg (2.5 mg base)/3 mL nebulization soln: ORAL | @ 06:00:00

## 2016-07-30 NOTE — Progress Notes (Addendum)
[facility]  [place], [place] [address]    IP PROGRESS NOTE      [name], [name]  Date of Admission:  07/27/2016  Date of Birth:  1991/01/23  Date of Service:  07/30/2016    Chief Complaint: epigastric pain radiating to back improving, no vomiting.   Subjective: pruritic rash present, no trouble swallowing, no breathing problem    Vital Signs:  Temp (24hrs) Max:37.3 C (99.2 F)      Temperature: 37 C (98.6 F)  BP (Non-Invasive): (!) 141/87  MAP (Non-Invasive): 100 mmHG  Heart Rate: 67  Respiratory Rate: 18  Pain Score (Numeric, Faces): 10  SpO2-1: 99 %    Current Medications:    Current Facility-Administered Medications:  acetaminophen (TYLENOL) tablet 650 mg Oral Q4H PRN   adult custom parenteral nutrition with CLINIMIX  Intravenous Continuous   adult custom parenteral nutrition with CLINIMIX  Intravenous Continuous   buprenorphine (SUBUTEX) sublingual tablet 8 mg Sublingual Daily   diatrizoate meglumine & sodium (MD-GASTROVIEW) 15 mL in 500 mL SW oral solution 500 mL Oral Give in Radiology   diphenhydrAMINE (BENADRYL) capsule 50 mg Oral Q6H PRN   enoxaparin (LOVENOX) 40 mg/0.4 mL SubQ injection 40 mg Subcutaneous Daily   fat emulsion (INTRALIPID) 20% infusion 30 g Intravenous Once   fat emulsion (INTRALIPID) 20% infusion 30 g Intravenous Once   [START ON 07/31/2016] fat emulsion (INTRALIPID) 20% infusion 30 g Intravenous Once   heparin flush (HEPFLUSH) 10 units/mL injection 2 mL Intracatheter Q12H   HYDROmorphone (DILAUDID) 2 mg/mL injection 2 mg Intravenous Q3H PRN   LORazepam (ATIVAN) 2 mg/mL injection 1 mg Intravenous Q6H PRN   LORazepam (ATIVAN) 2 mg/mL injection 1 mg Intravenous Q4H PRN   LR premix infusion  Intravenous Continuous   nicotine (NICODERM CQ) transdermal patch (mg/24 hr) 21 mg Transdermal Daily   nitroGLYCERIN (NITROSTAT) sublingual tablet 0.4 mg Sublingual Q5 Min PRN   NS flush syringe 10 mL Intravenous Q8HRS   NS flush syringe 10 mL Intravenous Q1H PRN   ondansetron  (ZOFRAN) 2 mg/mL injection 4 mg Intravenous Q4H PRN   Pharmacy TPN Consult  Intravenous Daily PRN   raNITIdine (ZANTAC) 25 mg/mL injection 50 mg Intravenous Q8H       Today's Physical Exam:    GENERAL: Patient is alert, awake and oriented X 3. In no cardio respiratory distress.   HEENT: PERRLA, EOMI.   NECK: Supple, NO JVD.   HEART: S1+, S2+, rate controlled and rhythm regular. No murmurs appreciated.   LUNGS: Clear to auscultation bilaterally.   ABDOMEN: Soft, NT, no guarding, ND with NABS.   EXTREMITIES: No edema, pedal pulses palpable. Rt arm forearm, , pruritic rash coalescing.  NEURO: Cranial nerves 2 to 12 grossly intact, Motor exam is non focal.   SKIN: No rashes or bruises.   PSYCH: depressed      I/O:  I/O last 24 hours:      Intake/Output Summary (Last 24 hours) at 07/30/16 1547  Last data filed at 07/30/16 1308   Gross per 24 hour   Intake                0 ml   Output             1600 ml   Net            -1600 ml     I/O current shift:  09/29 0800 - 09/29 1559  In: -   Out: 300 [Urine:300]  Nutrition/Residuals:  adult custom parenteral nutrition with CLINIMIX  adult custom parenteral nutrition with CLINIMIX  DIET CLEAR LIQUID    Labs    Reviewed:   Lab Results for Last 24 Hours:    Results for orders placed or performed during the hospital encounter of 07/27/16 (from the past 24 hour(s))   TYPE AND SCREEN   Result Value Ref Range    UNITS ORDERED NOT STATED         ABO/RH(D) A POSITIVE     ANTIBODY SCREEN NEGATIVE     SPECIMEN EXPIRATION DATE 08/01/2016    CBC W/AUTO DIFF - [facility] ONLY   Result Value Ref Range    WBC 4.4 4.0 - 11.0 x103/uL    RBC 3.73 (L) 4.00 - 5.10 x106/uL    HGB 11.8 (L) 12.0 - 15.5 g/dL    HCT 16.136.1 09.636.0 - 04.545.0 %    MCV 96.8 82.0 - 97.0 fL    MCH 31.8 27.5 - 33.2 pg    MCHC 32.8 32.0 - 36.0 g/dL    RDW 40.914.0 81.111.0 - 91.416.0 %    PLATELETS 121 (L) 150 - 450 x103/uL    MPV 10.2 7.4 - 10.5 fL    NEUTROPHIL % 62 43 - 76 %    LYMPHOCYTE % 26 15 - 43 %    MONOCYTE % 9 5 - 12 %    EOSINOPHIL % 3  0 - 5 %    BASOPHIL % 1 0 - 3 %    NEUTROPHIL # 2.70 1.50 - 6.50 x103/uL    LYMPHOCYTE # 1.10 1.00 - 4.80 x103/uL    MONOCYTE # 0.40 0.20 - 0.90 x103/uL    EOSINOPHIL # 0.10 0.00 - 0.50 x103/uL    BASOPHIL # 0.00 0.00 - 0.10 x103/uL   LACTIC ACID   Result Value Ref Range    LACTIC ACID 0.9 0.5 - 2.0 mmol/L   POC FINGERSTICK GLUCOSE - [facility] (RESULTS)   Result Value Ref Range    GLUCOSE, POC 121 (H) 60 - 100 mg/dL   POC FINGERSTICK GLUCOSE - [facility] (RESULTS)   Result Value Ref Range    GLUCOSE, POC 90 60 - 100 mg/dL   POC FINGERSTICK GLUCOSE - [facility] (RESULTS)   Result Value Ref Range    GLUCOSE, POC 112 (H) 60 - 100 mg/dL   RENAL FUNCTION PANEL - TPN DAY 3   Result Value Ref Range    SODIUM 142 136 - 145 mmol/L    POTASSIUM 3.9 3.4 - 5.1 mmol/L    CHLORIDE 108 101 - 111 mmol/L    CO2 TOTAL 28 22 - 32 mmol/L    ANION GAP 6 3 - 11 mmol/L    BUN 8 6 - 20 mg/dL    CREATININE 7.820.67 9.560.44 - 1.00 mg/dL    BUN/CREA RATIO 12 6 - 22    ESTIMATED GFR >60 >60 mL/min/1.1673m2    CALCIUM 9.4 8.6 - 10.3 mg/dL    GLUCOSE 213105 70 - 086110 mg/dL    PHOSPHORUS 4.6 (H) 2.7 - 4.5 mg/dL    ALBUMIN 3.2 (L) 3.5 - 5.0 g/dL   MAGNESIUM - TPN DAY 3   Result Value Ref Range    MAGNESIUM 1.8 1.4 - 2.1 mg/dL   PREALBUMIN - EVERY FRIDAY FOR 2 WEEKS - TPN   Result Value Ref Range    PREALBUMIN 19.1 18.0 - 38.0 mg/dL   COMPREHENSIVE METABOLIC PROFILE - [facility] ONLY   Result Value Ref Range  SODIUM 142 136 - 145 mmol/L    POTASSIUM 3.9 3.4 - 5.1 mmol/L    CHLORIDE 108 101 - 111 mmol/L    CO2 TOTAL 28 22 - 32 mmol/L    ANION GAP 6 3 - 11 mmol/L    BUN 8 6 - 20 mg/dL    CREATININE 1.61 0.96 - 1.00 mg/dL    BUN/CREA RATIO 12 6 - 22    ESTIMATED GFR >60 >60 mL/min/1.9m2    ALBUMIN 3.2 (L) 3.5 - 5.0 g/dL    CALCIUM 9.4 8.6 - 04.5 mg/dL    GLUCOSE 409 70 - 811 mg/dL    ALKALINE PHOSPHATASE 69 38 - 126 U/L    ALT (SGPT) 16 14 - 54 U/L    AST (SGOT) 27 15 - 41 U/L    BILIRUBIN TOTAL 0.7 0.3 - 1.2 mg/dL    PROTEIN TOTAL 5.7 (L) 6.4 - 8.3 g/dL     ALBUMIN/GLOBULIN RATIO 1.3 0.8 - 2.0   AMYLASE   Result Value Ref Range    AMYLASE 182 (H) 5 - 100 U/L   LIPASE   Result Value Ref Range    LIPASE 361 (H) 22 - 51 U/L   CBC WITH DIFF   Result Value Ref Range    WBC 4.5 4.0 - 11.0 x103/uL    RBC 3.36 (L) 4.00 - 5.10 x106/uL    HGB 10.7 (L) 12.0 - 15.5 g/dL    HCT 91.4 (L) 78.2 - 45.0 %    MCV 96.7 82.0 - 97.0 fL    MCH 31.7 27.5 - 33.2 pg    MCHC 32.8 32.0 - 36.0 g/dL    RDW 95.6 21.3 - 08.6 %    PLATELETS 125 (L) 150 - 450 x103/uL    MPV 10.0 7.4 - 10.5 fL    NEUTROPHIL % 60 43 - 76 %    LYMPHOCYTE % 25 15 - 43 %    MONOCYTE % 11 5 - 12 %    EOSINOPHIL % 4 0 - 5 %    BASOPHIL % 1 0 - 3 %    NEUTROPHIL # 2.70 1.50 - 6.50 x103/uL    LYMPHOCYTE # 1.10 1.00 - 4.80 x103/uL    MONOCYTE # 0.50 0.20 - 0.90 x103/uL    EOSINOPHIL # 0.20 0.00 - 0.50 x103/uL    BASOPHIL # 0.00 0.00 - 0.10 x103/uL   POC FINGERSTICK GLUCOSE - [facility] (RESULTS)   Result Value Ref Range    GLUCOSE, POC 79 60 - 100 mg/dL         Diagnostic Tests   Reviewed    Problem List:  Active Hospital Problems   (*Primary Problem)    Diagnosis    *Acute alcoholic pancreatitis    Primary thrombocytopenia       Assessment/ Plan:  Acute epigastric pain- pancreatitis. Improving, advance to clears, pain meds, iv fluids.    Allergic reaction- to PCN hold PCN ,Zantac, prn bendaryl. Consider steroids if worsens , will avoid in the setting of pancreatitis.    Alcohol Abuse: will need crisis worker consult when acute issues resolve.  IV Ativan PRN for alcohol withdrawal.     Thrombocytopenia: alcohol related marrow suppression, no bleeding, monitor counts.    Heroin Abuse:  Suboxone.    Hx of Hep C- monitor LFT.     Tobacco Use Disorder: counseled on cessation. Nicoderm ordered.     DVT PPx: Lovenox  Code Status:  Full Code  Jewel Baize, MD  07/30/2016, 15:47  Coding clarification- U/A normal, no cystitis.

## 2016-07-30 NOTE — Nurses Notes (Signed)
Assumed care of patient at this time. Patient sitting in bed, anxious for upcoming test. Questions answered and will continue to monitor.

## 2016-07-30 NOTE — Progress Notes (Signed)
Surgery    HIDA normal  RUQ without stones    No surgical intervention needed  Will sign off    Duanne MoronJason L Tanvir Hipple, MD  07/30/2016, 14:26

## 2016-07-30 NOTE — Pharmacy (Signed)
TPN Therapy Follow Up  07/30/2016       [name],[name]  Date of Birth:  03/05/1991    TPN Base: (Clinimix) amino acids 4.25% infusion in D5W  Lipids: yes  Central Line: yes  Rate: 70 mL/hr      TPN Order Summary      The values shown are based on the patient receiving 0.81 bags over 24 hours.           Range     Amino Acids (g) 68.85 --     Dextrose (g) 81 --     Dextrose Concentration (%) 4.85 --     Volume 1,671.19 --     Infusion Rate (mL/hr) 70      Infusion Site Central      Multi-vitamins (mL) 8.1 --     Trace Elements (mL) 0.81 --     Weight Used 58.7 kg         Caloric Contribution          kcal/kg kcal %     Protein 4.69 275.4 50     Dextrose 4.69 275.4 50     Lipids -- -- --     Total 9.38 550.8                Electrolytes      Cations Amount Range     Sodium (mEq) 8.1 --     Potassium (mEq) 56.7 --     Calcium (mEq) 4.07 --     Magnesium (mEq) 12.64 --     Anions           Phosphate (mmol) -- --     Chloride (mEq) 76.14 --     Acetate (mEq) 76.14 --        Mixture Compatibility            Range     Calcium Phosphate Solubility Curve (mEq/L of Calcium) -- --     Osmolarity 751.86 --     Calcium: Phosphate Ratio (mEq: mmol) -- --                    Comments: No change    Pharmacy will continue to follow [name]'s TPN therapy.  Please contact the pharmacy with any questions.    Wilmer Floorammy Dael Howland, [facility] Memorial HospitalRPH  07/30/2016 13:33

## 2016-07-30 NOTE — Progress Notes (Addendum)
Spoke with Dr.Masilisis,hida not read initially as cholecystitis but furher images needed per Dr.Masiilides      Addendum :HIDA negative for cholecystitis,Will order clear liquid diet.

## 2016-07-30 NOTE — Nurses Notes (Signed)
Patient complained of pain and anxiety throughout the day. Gave patient PRN medications. Patient still okay with plan of care. Patient was upset with hospitalist. Dr. Ramiro HarvestParikh notified and denied change of doctors. Will continue to monitor.

## 2016-07-30 NOTE — Nurses Notes (Signed)
Patient refusing TPN and IV fluids at this time because it is making her swell. Patient was educated on the importance of receiving both medications. Patient acknowledged understanding. Dr. Bing Quarryebord paged at this time.

## 2016-07-30 NOTE — Progress Notes (Signed)
[facility]  [facility] Consult  Follow Up Note      [name],[name], 25 y.o. female  Date of Service: 07/30/2016  Date of Birth:  08/30/1991    Hospital Day:  LOS: 3 days     Chief Complaint:  Pancreatitis  Subjective: Less abdominal pain,pt complaining of chest pain and leg swelling and rash    Objective:  Temperature: 37.1 C (98.7 F)  Heart Rate: 62  BP (Non-Invasive): (!) 138/98  Respiratory Rate: 18  SpO2-1: 100 %  Pain Score (Numeric, Faces): 10  Constitutional:  mild distress  Respiratory:  Clear to auscultation bilaterally. , anteriorly  Cardiovascular:  regular rate and rhythm  Gastrointestinal:  Soft, non-tender  Neurologic:  Grossly normal    Labs:  I have reviewed all lab results.    Imaging Studies:  for HIDA scan    Assessment/Recommendations:  For HIDA if okay,start clear liquids.  Zara Chessimothy Arma Reining, MD

## 2016-07-30 NOTE — Nurses Notes (Signed)
Assumed care of patient at this time. Patient walking around room. Will continue to monitor.

## 2016-07-30 NOTE — Ancillary Notes (Signed)
[facility]  Medical Nutrition Therapy Follow Up       Reason for Assessment: Follow up  Priority level:  1  Follow in 2-3 days.    SUBJECTIVE :   TPN still infusing. Pt now has central line.    OBJECTIVE:  Height: 162.6 cm (_0 )  Weight: 58.7 kg (129 lb 6.4 oz) - wt increase is likely more related to fluids than a representation of actual weight gain.  BMI (Calculated): 22.26    IBW:  55 kg      Labs:   I have reviewed all lab results.  Lab Results for Last 24 Hours:    Results for orders placed or performed during the hospital encounter of 07/27/16 (from the past 24 hour(s))   TYPE AND SCREEN   Result Value Ref Range    UNITS ORDERED NOT STATED         ABO/RH(D) A POSITIVE     ANTIBODY SCREEN NEGATIVE     SPECIMEN EXPIRATION DATE 08/01/2016    CBC W/AUTO DIFF - [facility] ONLY   Result Value Ref Range    WBC 4.4 4.0 - 11.0 x103/uL    RBC 3.73 (L) 4.00 - 5.10 x106/uL    HGB 11.8 (L) 12.0 - 15.5 g/dL    HCT 36.1 36.0 - 45.0 %    MCV 96.8 82.0 - 97.0 fL    MCH 31.8 27.5 - 33.2 pg    MCHC 32.8 32.0 - 36.0 g/dL    RDW 14.0 11.0 - 16.0 %    PLATELETS 121 (L) 150 - 450 x103/uL    MPV 10.2 7.4 - 10.5 fL    NEUTROPHIL % 62 43 - 76 %    LYMPHOCYTE % 26 15 - 43 %    MONOCYTE % 9 5 - 12 %    EOSINOPHIL % 3 0 - 5 %    BASOPHIL % 1 0 - 3 %    NEUTROPHIL # 2.70 1.50 - 6.50 x103/uL    LYMPHOCYTE # 1.10 1.00 - 4.80 x103/uL    MONOCYTE # 0.40 0.20 - 0.90 x103/uL    EOSINOPHIL # 0.10 0.00 - 0.50 x103/uL    BASOPHIL # 0.00 0.00 - 0.10 x103/uL   LACTIC ACID   Result Value Ref Range    LACTIC ACID 0.9 0.5 - 2.0 mmol/L   POC FINGERSTICK GLUCOSE - [facility] (RESULTS)   Result Value Ref Range    GLUCOSE, POC 121 (H) 60 - 100 mg/dL   POC FINGERSTICK GLUCOSE - [facility] (RESULTS)   Result Value Ref Range    GLUCOSE, POC 90 60 - 100 mg/dL   POC FINGERSTICK GLUCOSE - [facility] (RESULTS)   Result Value Ref Range    GLUCOSE, POC 112 (H) 60 - 100 mg/dL   RENAL FUNCTION PANEL - TPN DAY 3   Result Value Ref Range    SODIUM 142 136 - 145  mmol/L    POTASSIUM 3.9 3.4 - 5.1 mmol/L    CHLORIDE 108 101 - 111 mmol/L    CO2 TOTAL 28 22 - 32 mmol/L    ANION GAP 6 3 - 11 mmol/L    BUN 8 6 - 20 mg/dL    CREATININE 0.67 0.44 - 1.00 mg/dL    BUN/CREA RATIO 12 6 - 22    ESTIMATED GFR >60 >60 mL/min/1.18m    CALCIUM 9.4 8.6 - 10.3 mg/dL    GLUCOSE 105 70 - 110 mg/dL    PHOSPHORUS 4.6 (H) 2.7 -  4.5 mg/dL    ALBUMIN 3.2 (L) 3.5 - 5.0 g/dL   MAGNESIUM - TPN DAY 3   Result Value Ref Range    MAGNESIUM 1.8 1.4 - 2.1 mg/dL   PREALBUMIN - EVERY FRIDAY FOR 2 WEEKS - TPN   Result Value Ref Range    PREALBUMIN 19.1 18.0 - 38.0 mg/dL   COMPREHENSIVE METABOLIC PROFILE - [facility] ONLY   Result Value Ref Range    SODIUM 142 136 - 145 mmol/L    POTASSIUM 3.9 3.4 - 5.1 mmol/L    CHLORIDE 108 101 - 111 mmol/L    CO2 TOTAL 28 22 - 32 mmol/L    ANION GAP 6 3 - 11 mmol/L    BUN 8 6 - 20 mg/dL    CREATININE 0.67 0.44 - 1.00 mg/dL    BUN/CREA RATIO 12 6 - 22    ESTIMATED GFR >60 >60 mL/min/1.45m    ALBUMIN 3.2 (L) 3.5 - 5.0 g/dL    CALCIUM 9.4 8.6 - 10.3 mg/dL    GLUCOSE 105 70 - 110 mg/dL    ALKALINE PHOSPHATASE 69 38 - 126 U/L    ALT (SGPT) 16 14 - 54 U/L    AST (SGOT) 27 15 - 41 U/L    BILIRUBIN TOTAL 0.7 0.3 - 1.2 mg/dL    PROTEIN TOTAL 5.7 (L) 6.4 - 8.3 g/dL    ALBUMIN/GLOBULIN RATIO 1.3 0.8 - 2.0   AMYLASE   Result Value Ref Range    AMYLASE 182 (H) 5 - 100 U/L   LIPASE   Result Value Ref Range    LIPASE 361 (H) 22 - 51 U/L   CBC WITH DIFF   Result Value Ref Range    WBC 4.5 4.0 - 11.0 x103/uL    RBC 3.36 (L) 4.00 - 5.10 x106/uL    HGB 10.7 (L) 12.0 - 15.5 g/dL    HCT 32.4 (L) 36.0 - 45.0 %    MCV 96.7 82.0 - 97.0 fL    MCH 31.7 27.5 - 33.2 pg    MCHC 32.8 32.0 - 36.0 g/dL    RDW 14.1 11.0 - 16.0 %    PLATELETS 125 (L) 150 - 450 x103/uL    MPV 10.0 7.4 - 10.5 fL    NEUTROPHIL % 60 43 - 76 %    LYMPHOCYTE % 25 15 - 43 %    MONOCYTE % 11 5 - 12 %    EOSINOPHIL % 4 0 - 5 %    BASOPHIL % 1 0 - 3 %    NEUTROPHIL # 2.70 1.50 - 6.50 x103/uL    LYMPHOCYTE # 1.10 1.00 - 4.80  x103/uL    MONOCYTE # 0.50 0.20 - 0.90 x103/uL    EOSINOPHIL # 0.20 0.00 - 0.50 x103/uL    BASOPHIL # 0.00 0.00 - 0.10 x103/uL   POC FINGERSTICK GLUCOSE - [facility] (RESULTS)   Result Value Ref Range    GLUCOSE, POC 79 60 - 100 mg/dL       Meds:   Current Facility-Administered Medications:  acetaminophen (TYLENOL) tablet 650 mg Oral Q4H PRN   adult custom parenteral nutrition with CLINIMIX  Intravenous Continuous   adult custom parenteral nutrition with CLINIMIX  Intravenous Continuous   buprenorphine (SUBUTEX) sublingual tablet 8 mg Sublingual Daily   diatrizoate meglumine & sodium (MD-GASTROVIEW) 15 mL in 500 mL SW oral solution 500 mL Oral Give in Radiology   diphenhydrAMINE (BENADRYL) capsule 50 mg Oral Q6H PRN   enoxaparin (LOVENOX)  40 mg/0.4 mL SubQ injection 40 mg Subcutaneous Daily   fat emulsion (INTRALIPID) 20% infusion 30 g Intravenous Once   fat emulsion (INTRALIPID) 20% infusion 30 g Intravenous Once   [START ON 07/31/2016] fat emulsion (INTRALIPID) 20% infusion 30 g Intravenous Once   heparin flush (HEPFLUSH) 10 units/mL injection 2 mL Intracatheter Q12H   HYDROmorphone (DILAUDID) 2 mg/mL injection 2 mg Intravenous Q3H PRN   LORazepam (ATIVAN) 2 mg/mL injection 1 mg Intravenous Q6H PRN   LORazepam (ATIVAN) 2 mg/mL injection 1 mg Intravenous Q4H PRN   LR premix infusion  Intravenous Continuous   nicotine (NICODERM CQ) transdermal patch (mg/24 hr) 21 mg Transdermal Daily   nitroGLYCERIN (NITROSTAT) sublingual tablet 0.4 mg Sublingual Q5 Min PRN   NS flush syringe 10 mL Intravenous Q8HRS   NS flush syringe 10 mL Intravenous Q1H PRN   ondansetron (ZOFRAN) 2 mg/mL injection 4 mg Intravenous Q4H PRN   Pharmacy TPN Consult  Intravenous Daily PRN   raNITIdine (ZANTAC) 25 mg/mL injection 50 mg Intravenous Q8H       Comments:   PICC placed on 9/28.  HIDA scan today - negative for cholecystitis.  Braden = 22; nutrition = 3; adequate.  NPO with TPN infusing. Supposed to start clear liquids now per GI note.  Labs  Reviewed: PAB normal, Alb 3.2    Nutritional Education needs:  None    DIAGNOSIS / INTERVENTION  Nutrition Diagnosis: Inadequate protein-energy intake ; related to Current medical condition  Pathophysiological  Altered GI length/function/motility ; as evidenced by inability to tolerate PO, Unintentional weight loss and GI symptoms abdominal pain/nausea - In progress, improving.    Nutrition Intervention :  1. Parenteral nutrition  2. Diet advancement   Nutrition Prescription:     1. Continue Clinimix 4.25/5 as ordered - rate is running at 70 ml/hr with 30 gm IL currently (goal recommended was 80 ml/hr). Please do not d/c parenteral nutrition until a PO (past clear liquids) is tolerated at by at least 50%.   2. If able to tolerate PO, recommend goal diet of Regular, low fat - diet advancement per GI.        Goals:  Tolerate diet/ nutrition support  Meet estimated needs for nutrients/fluids  Transition to oral feeds  Tolerate least restrictive diet  Nutritional needs met in 2 out of 3 meals - In progress.    Monitoring / Evaluation: Monitor: Oral Intake, Weight Status and Pertinent Labs albumin  Tolerance of : diet and nutrition support    Moise Boring, RDLD  07/30/2016, 14:08    Phone:  Ext  [phone]

## 2016-07-30 NOTE — Care Plan (Signed)
Problem: Pain, Acute (Adult)  Intervention: Monitor/Manage Analgesia    07/30/16 0900 07/30/16 1823   Promote Effective Bowel Elimination   Bowel Intervention --  adequate fluid intake promoted;ambulation promoted;privacy promoted;diet adjusted   Safety Interventions   Medication Review/Management medications reviewed;provider consulted --        Intervention: Mutually Develop/Implement Acute Pain Management Plan    07/30/16 0900   Cognitive Interventions   Sensory Stimulation Regulation care clustered;lighting decreased;music/television provided for relaxation;quiet environment promoted       Intervention: Support/Optimize Psychosocial Response to Acute Pain    07/30/16 0900   Psychosocial Support   Diversional Activities television   Family/Support System Care support provided   Trust Relationship/Rapport care explained;choices provided;emotional support provided;empathic listening provided;questions answered;questions encouraged;reassurance provided;thoughts/feelings acknowledged   Coping/Psychosocial Interventions   Supportive Measures active listening utilized;positive reinforcement provided;verbalization of feelings encouraged         Goal: Identify Related Risk Factors and Signs and Symptoms  Related risk factors and signs and symptoms are identified upon initiation of Human Response Clinical Practice Guideline (CPG).     07/29/16 0710   Pain, Acute   Related Risk Factors (Acute Pain) disease process;persistent pain       Goal: Acceptable Pain Control/Comfort Level  Patient will demonstrate the desired outcomes by discharge/transition of care.     07/30/16 1823   Pain, Acute (Adult)   Acceptable Pain Control/Comfort Level making progress toward outcome         Problem: Pancreatitis, Acute/Chronic (Adult)  Prevent and manage potential problems including: 1. diarrhea 2. fluid imbalance 3. hemorrhage 4. hypercatabolism 5. hyperglycemia 6. hypoxia/hypoxemia 7. infection 8. malabsorption/maldigestion 9. nausea and  vomiting 10. pain 11. postoperative ileus 12. situational response   Intervention: Monitor/Manage Pain    07/30/16 0900   Safety Interventions   Medication Review/Management medications reviewed;provider consulted       Intervention: Monitor/Manage Fluid Electrolyte Balance    07/30/16 1823   Nutrition Interventions   Fluid/Electrolyte Management intravenous fluids adjusted;fluids provided       Intervention: Optimize Glycemic Control    07/30/16 1823   Nutrition Interventions   Glycemic Management blood glucose monitoring         Goal: Signs and Symptoms of Listed Potential Problems Will be Absent, Minimized or Managed (Pancreatitis, Acute/Chronic)  Signs and symptoms of listed potential problems will be absent, minimized or managed by discharge/transition of care (reference Pancreatitis, Acute/Chronic (Adult) CPG).     07/30/16 0900   Pancreatitis, Acute/Chronic   Problems Assessed (Pancreatitis) all   Problems Present (Pancreatitis) pain;situational response

## 2016-07-31 LAB — CBC WITH DIFF
BASOPHIL #: 0 10*3/uL (ref 0.00–0.10)
EOSINOPHIL #: 0.2 10*3/uL (ref 0.00–0.50)
EOSINOPHIL %: 5 % (ref 0–5)
HCT: 32.9 % — ABNORMAL LOW (ref 36.0–45.0)
LYMPHOCYTE #: 1.1 10*3/uL (ref 1.00–4.80)
LYMPHOCYTE %: 30 % (ref 15–43)
MCH: 31.6 pg (ref 27.5–33.2)
MCHC: 32.4 g/dL (ref 32.0–36.0)
MCV: 97.5 fL — ABNORMAL HIGH (ref 82.0–97.0)
MONOCYTE #: 0.4 10*3/uL (ref 0.20–0.90)
MONOCYTE %: 10 % (ref 5–12)
NEUTROPHIL #: 2 10*3/uL (ref 1.50–6.50)
NEUTROPHIL %: 54 % (ref 43–76)
PLATELETS: 152 10*3/uL (ref 150–450)
RBC: 3.37 10*6/uL — ABNORMAL LOW (ref 4.00–5.10)
RDW: 14.1 % (ref 11.0–16.0)

## 2016-07-31 LAB — COMPREHENSIVE METABOLIC PROFILE - BMC/JMC ONLY
ALBUMIN: 3.1 g/dL — ABNORMAL LOW (ref 3.5–5.0)
ALKALINE PHOSPHATASE: 66 U/L (ref 38–126)
ALT (SGPT): 14 U/L (ref 14–54)
ANION GAP: 8 mmol/L (ref 3–11)
AST (SGOT): 24 U/L (ref 15–41)
BILIRUBIN TOTAL: 0.7 mg/dL (ref 0.3–1.2)
BUN/CREA RATIO: 10 (ref 6–22)
BUN: 7 mg/dL (ref 6–20)
CHLORIDE: 105 mmol/L (ref 101–111)
CO2 TOTAL: 29 mmol/L (ref 22–32)
CREATININE: 0.67 mg/dL (ref 0.44–1.00)
ESTIMATED GFR: >60 mL/min/1.73mˆ2 (ref >60)
GLUCOSE: 74 mg/dL (ref 70–110)
POTASSIUM: 3.9 mmol/L (ref 3.4–5.1)
PROTEIN TOTAL: 5.5 g/dL — ABNORMAL LOW (ref 6.4–8.3)
SODIUM: 142 mmol/L (ref 136–145)

## 2016-07-31 LAB — AMYLASE
AMYLASE: 106 U/L — ABNORMAL HIGH (ref 5–100)
AMYLASE: 106 U/L — ABNORMAL HIGH (ref 5–100)

## 2016-07-31 LAB — RENAL FUNCTION PANEL
ALBUMIN: 3.1 g/dL — ABNORMAL LOW (ref 3.5–5.0)
ANION GAP: 8 mmol/L (ref 3–11)
BUN/CREA RATIO: 10 (ref 6–22)
BUN: 7 mg/dL (ref 6–20)
CALCIUM: 9.3 mg/dL (ref 8.6–10.3)
CHLORIDE: 105 mmol/L (ref 101–111)
CO2 TOTAL: 29 mmol/L (ref 22–32)
CREATININE: 0.67 mg/dL (ref 0.44–1.00)
GLUCOSE: 74 mg/dL (ref 70–110)
PHOSPHORUS: 4.4 mg/dL (ref 2.7–4.5)
POTASSIUM: 3.9 mmol/L (ref 3.4–5.1)
SODIUM: 142 mmol/L (ref 136–145)

## 2016-07-31 LAB — POC FINGERSTICK GLUCOSE - BMC/JMC (RESULTS)
GLUCOSE, POC: 79 mg/dL (ref 60–100)
GLUCOSE, POC: 93 mg/dL (ref 60–100)

## 2016-07-31 LAB — LIPASE: LIPASE: 211 U/L — ABNORMAL HIGH (ref 22–51)

## 2016-07-31 LAB — MAGNESIUM: MAGNESIUM: 2 mg/dL (ref 1.4–2.1)

## 2016-07-31 MED ORDER — TRAMADOL 50 MG TABLET
50.00 mg | ORAL_TABLET | Freq: Four times a day (QID) | ORAL | 0 refills | Status: AC | PRN
Start: 2016-07-31 — End: ?

## 2016-07-31 MED ORDER — DIPHENHYDRAMINE-ZINC ACETATE 1 %-0.1 % TOPICAL CREAM
TOPICAL_CREAM | Freq: Four times a day (QID) | CUTANEOUS | 0 refills | Status: AC | PRN
Start: 2016-07-31 — End: 2016-08-07

## 2016-07-31 MED ORDER — TRAMADOL 50 MG TABLET
50.00 mg | ORAL_TABLET | Freq: Four times a day (QID) | ORAL | 0 refills | Status: DC | PRN
Start: 2016-07-31 — End: 2016-07-31

## 2016-07-31 MED ORDER — TRAMADOL 50 MG TABLET
50.00 mg | ORAL_TABLET | Freq: Four times a day (QID) | ORAL | Status: DC | PRN
Start: 2016-07-31 — End: 2016-07-31
  Administered 2016-07-31: 50 mg via ORAL
  Filled 2016-07-31: qty 1

## 2016-07-31 MED ORDER — BACITRACIN 500 UNIT/GRAM TOPICAL PACKET
1.0000 | PACK | Freq: Once | CUTANEOUS | Status: DC
Start: 2016-07-31 — End: 2016-07-31

## 2016-07-31 MED ORDER — DIPHENHYDRAMINE 25 MG CAPSULE
25.00 mg | ORAL_CAPSULE | Freq: Four times a day (QID) | ORAL | 0 refills | Status: AC | PRN
Start: 2016-07-31 — End: ?

## 2016-07-31 MED ORDER — DIPHENHYDRAMINE 25 MG CAPSULE
25.00 mg | ORAL_CAPSULE | Freq: Four times a day (QID) | ORAL | 0 refills | Status: DC | PRN
Start: 2016-07-31 — End: 2016-07-31

## 2016-07-31 MED ORDER — ACETAMINOPHEN 500 MG TABLET
500.00 mg | ORAL_TABLET | Freq: Four times a day (QID) | ORAL | Status: DC | PRN
Start: 2016-07-31 — End: 2016-07-31

## 2016-07-31 MED ORDER — RANITIDINE 150 MG TABLET
150.00 mg | ORAL_TABLET | Freq: Two times a day (BID) | ORAL | Status: DC
Start: 2016-07-31 — End: 2016-07-31
  Administered 2016-07-31: 150 mg via ORAL
  Filled 2016-07-31: qty 1

## 2016-07-31 MED ORDER — BACITRACIN 500 UNIT/GRAM TOPICAL PACKET
PACK | CUTANEOUS | Status: DC
Start: 2016-07-31 — End: 2016-07-31
  Filled 2016-07-31: qty 1

## 2016-07-31 MED ORDER — DIPHENHYDRAMINE-ZINC ACETATE 1 %-0.1 % TOPICAL CREAM
TOPICAL_CREAM | Freq: Four times a day (QID) | CUTANEOUS | 0 refills | Status: DC | PRN
Start: 2016-07-31 — End: 2016-07-31

## 2016-07-31 MED ORDER — LORAZEPAM 2 MG/ML INJECTION SOLUTION
1.00 mg | Freq: Three times a day (TID) | INTRAMUSCULAR | Status: DC | PRN
Start: 2016-07-31 — End: 2016-07-31
  Administered 2016-07-31: 1 mg via INTRAVENOUS
  Filled 2016-07-31: qty 1

## 2016-07-31 MED ORDER — FUROSEMIDE 10 MG/ML INJECTION SOLUTION
20.0000 mg | Freq: Once | INTRAMUSCULAR | Status: AC
Start: 2016-07-31 — End: 2016-07-31
  Administered 2016-07-31: 20 mg via INTRAVENOUS
  Filled 2016-07-31: qty 2

## 2016-07-31 MED ORDER — HYDROMORPHONE 2 MG/ML INJECTION SYRINGE
2.00 mg | INJECTION | INTRAMUSCULAR | Status: DC | PRN
Start: 2016-07-31 — End: 2016-07-31
  Administered 2016-07-31 (×2): 2 mg via INTRAVENOUS
  Filled 2016-07-31 (×2): qty 1

## 2016-07-31 MED ORDER — RANITIDINE 150 MG TABLET
150.00 mg | ORAL_TABLET | Freq: Two times a day (BID) | ORAL | 0 refills | Status: AC
Start: 2016-07-31 — End: 2016-08-07

## 2016-07-31 MED ADMIN — lactated Ringers intravenous solution: ORAL | @ 12:00:00 | NDC 00338011704

## 2016-07-31 MED ADMIN — white petrolatum 41 % topical ointment: TOPICAL | @ 07:00:00 | NDC 7214063377

## 2016-07-31 MED ADMIN — acetaminophen 325 mg tablet: ORAL | @ 03:00:00

## 2016-07-31 MED ADMIN — raNITIdine 150 mg tablet: ORAL | @ 09:00:00

## 2016-07-31 NOTE — Nurses Notes (Signed)
07/30/16 2135   Skin   Skin Integrity rash  (bil upper extremities, R>L, upper anterior chest hives )     Patient c/o increased itching and discomfort that started earlier today despite of PO benedryl. Dr. Melvyn NethLewis notified. Topical benedryl ordered. Patient received dose, reported improvement in symptoms.

## 2016-07-31 NOTE — Nurses Notes (Signed)
Shift report provided to Nick Yocco, RN with opportunity to ask questions and clarify issues.

## 2016-07-31 NOTE — Progress Notes (Addendum)
[facility]  [facility]  Follow Up Note      [name] CrutchShatzer,[name], 25 y.o. female  Date of Service: 07/31/2016  Date of Birth:  07/18/1991    Hospital Day:  LOS: 4 days     Chief Complaint:  Etoh pancreatitis  Subjective: Tolerated clear liquids/no abdominal pain    Objective:  Temperature: 36.7 C (98.1 F)  Heart Rate: 55  BP (Non-Invasive): (!) 155/109  Respiratory Rate: 16  SpO2-1: 100 %  Pain Score (Numeric, Faces): 10  Constitutional:  no distress  Respiratory:  Clear to auscultation bilaterally. , anteriorly  Cardiovascular:  regular rate and rhythm  Gastrointestinal:  Soft, non-tender  Neurologic:  Grossly normal    Labs:  I have reviewed all lab results.    Imaging Studies:  N/A  HIDA negative for acute cholecystitis    Assessment/Recommendations:  Amylase and lipase coming down.Will advance to low fat diet.Okay for discharge from gi standpoint.Suggest continue zantac 150 mg bid x 8 weeks. .Pt should avoid all alcohol,see is highly aware  and also should avoid NSAIDs.Hepatitis C antibody positive.Hepatitis C rna pending as is hepatitis B DNA and hemochromatosis DNA.Pt should see me in follow up in 6-8 wweks for fatty liver and hepatitis C.Will need referal to Dr.Simmons if Hepatitis C rna postive.  Zara Chessimothy [name] Newmark, MD

## 2016-07-31 NOTE — Nurses Notes (Signed)
Pt refused TPN at this time. Pt disconnected herself from pump.   Pt states she is edematous due to the TPN.   Pt states she will continue with the LR.   Will continue to monitor.

## 2016-07-31 NOTE — Nurses Notes (Signed)
Assumed care of pt at this time.

## 2016-07-31 NOTE — Care Plan (Signed)
Problem: Depression (Adult,Obstetrics,Pediatric)  Goal: Improved/Stable Mood  Patient will demonstrate the desired outcomes by discharge/transition of care.   Outcome: Ongoing (see interventions/notes)  Patient moderately anxious, refers to her home problems and impact on her mood.     Problem: Pancreatitis, Acute/Chronic (Adult)  Prevent and manage potential problems including: 1. diarrhea 2. fluid imbalance 3. hemorrhage 4. hypercatabolism 5. hyperglycemia 6. hypoxia/hypoxemia 7. infection 8. malabsorption/maldigestion 9. nausea and vomiting 10. pain 11. postoperative ileus 12. situational response   Intervention: Monitor/Manage Pain    07/30/16 2135   Safety Interventions   Medication Review/Management medications reviewed;provider consulted      Patient advised that she would not be awakened for any prn medications. Patient set her alarm clock for prn medication although advised against it. Patient was encouraged to try and rest.   Intervention: Monitor/Manage Nutrition Support    07/31/16 0336   Nutrition Interventions   Nutrition Support Management other (see comments)      Patient reported she has been having friends/family bring in food from home during her NPO status and clear liquid status since she was readmitted. She reports having eggs and "other things." Patient was adamant that she would continue to have outside food brought in despite knowledge of her diet order. Patient advised to try and minimize foods with concentrated sugars. Patient reports that her symptoms have improved since eating solid food.       Comments:   Patient reports increasing discomfort from upper body rash improved after topical benedryl. Patient reports prn pain medications have lost efficacy. VSS. Patient ambulating in hallway frequently. Reports tolerating diet well with home supplements.

## 2016-07-31 NOTE — Nurses Notes (Signed)
Patient discharged home with family.  AVS reviewed with patient/care giver.  A written copy of the AVS and discharge instructions was given to the patient/care giver.  Questions sufficiently answered as needed.  Patient/care giver encouraged to follow up with PCP as indicated.  In the event of an emergency, patient/care giver instructed to call 911 or go to the nearest emergency room.     4 Prescriptions given to pt.   PICC line removed.

## 2016-07-31 NOTE — Discharge Summary (Addendum)
[facility]    DISCHARGE SUMMARY      PATIENT NAME:  [name],[name]  MRN:  [id number]  DOB:  07/19/1991    ADMISSION DATE:  07/23/2016  DISCHARGE DATE:  07/31/2016    ATTENDING PHYSICIAN: No att. providers found  PRIMARY CARE PHYSICIAN: Ruffin FrederickJoy Ramsay Farah, MD     ADMISSION DIAGNOSIS: <principal problem not specified>  Chief Complaint   Patient presents with    Abdominal Pain       DISCHARGE DIAGNOSIS:     Principal Problem:      Active Hospital Problems    Diagnosis Date Noted    Hypokalemia 07/26/2016    Thrombocytopenia Unspecified 07/24/2016    Acute pancreatitis 07/23/2016      Resolved Hospital Problems    Diagnosis    No resolved problems to display.     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       Details    buprenorphine HCl 8 mg Tablet, Sublingual   Commonly known as:  SUBUTEX    8 mg, Sublingual, 2x/day   Refills:  0             DISCHARGE INSTRUCTIONS:   No discharge procedures on file.      REASON FOR HOSPITALIZATION AND HOSPITAL COURSE:  This is a 25 y.o., female with history of substance abuse, alcohol abuse who presented with persistent pain in abdomen and vomiting 3. Treated conservatively for acute pancreatitis.  Who CT scan showed thickened gallbladder wall.  Cholecystitis ruled out with HIDA scan.  Patient's symptoms improved patient discharged with advise to follow up with PCP and GI.  Advised to quit smoking.  Patient also developed a rash due to allergy to penicillin.  Discharged with the Benadryl and Zantac.  Will hold off on steroids due to recent pancreatitis.  Advised to not use penicillin in future.  And follow up with PCP in 3 days time for reassessment of the rash.    SIGNIFICANT PHYSICAL  FINDINGS: GENERAL: Patient is alert, awake and oriented X 3. In no cardio respiratory distress.   HEENT: PERRLA, EOMI.   NECK: Supple, NO JVD.   HEART: S1+, S2+, rate controlled and rhythm regular. No murmurs appreciated.   LUNGS: Clear to auscultation bilaterally.   ABDOMEN: Soft, NT, ND with NABS.   EXTREMITIES: No edema, pedal pulses palpable.   NEURO: Cranial nerves 2 to 12 grossly intact, Motor exam is non focal.   SKIN: rash erythematous b/l UE and neck   PSYCH: Pleasant with no signs of depression.      SIGNIFICANT LAB: emr  SIGNIFICANT RADIOLOGY: emr  CONSULTATIONS: gi surgery          COURSE IN HOSPITAL:as above  DOES PATIENT HAVE ADVANCED DIRECTIVES:  No, Information Offered and Given    ADVANCED CARE PLANNING - full code    CONDITION ON DISCHARGE: Alert, Oriented and VS Stable    DISCHARGE DISPOSITION:  Home discharge     Copies sent to Care Team       Relationship Specialty Notifications Start End    Ruffin FrederickFarah, Joy Ramsay, MD PCP - General EXTERNAL  07/24/16     Phone: [phone] Fax: [phone]         [address] [place] [address]        Time spent on discharge 32 minutes      Coding clarification    No e/o cystitis per U/A and culture.

## 2016-08-03 LAB — HEMOCHROMATOSIS HFE GENE ANALYSIS, BLOOD

## 2016-08-03 LAB — ADULT ROUTINE BLOOD CULTURE, SET OF 2 BOTTLES (BACTERIA AND YEAST): BLOOD CULTURE, ROUTINE: NO GROWTH

## 2016-08-11 NOTE — Discharge Summary (Signed)
[facility]    DISCHARGE SUMMARY      PATIENT NAME:  [name],[name]  MRN:  [id number]  DOB:  05/04/1991    ADMISSION DATE:  07/27/2016  DISCHARGE DATE:  07/31/2016    ATTENDING PHYSICIAN:Genesys Coggeshall, MD    PRIMARY CARE PHYSICIAN: Ruffin FrederickJoy Ramsay Farah, MD     ADMISSION DIAGNOSIS: Acute alcoholic pancreatitis  Chief Complaint   Patient presents with    Abdominal Pain       DISCHARGE DIAGNOSIS:     Principal Problem:  Acute alcoholic pancreatitis    Active Hospital Problems    Diagnosis Date Noted    Principle Problem: Acute alcoholic pancreatitis 07/27/2016    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     Active Non-Hospital Problems    Diagnosis Date Noted    Hypokalemia 07/26/2016    Thrombocytopenia Unspecified 07/24/2016    Acute pancreatitis 07/23/2016      Allergies   Allergen Reactions    Penicillins Rash        DISCHARGE MEDICATIONS:     Current Discharge Medication List      START taking these medications.       Details    diphenhydrAMINE 25 mg Capsule   Commonly known as:  BENADRYL    25 mg, Oral, Q6H PRN   Qty:  10 Cap   Refills:  0       traMADol 50 mg Tablet   Commonly known as:  ULTRAM    50 mg, Oral, Q6H PRN   Qty:  8 Tab   Refills:  0         CONTINUE these medications - NO CHANGES were made during your visit.       Details    buprenorphine HCl 8 mg Tablet, Sublingual   Commonly known as:  SUBUTEX    8 mg, Sublingual, 2x/day   Refills:  0         ASK your doctor about these medications.       Details    diphenhydramine 1-0.1 % Cream   Commonly known as:  BENADRYL   Ask about: Should I take this medication?    Topical, 4x/day PRN   Qty:  1 Tube   Refills:  0       raNITIdine 150 mg Tablet   Commonly known as:  ZANTAC   Ask about: Should I take this medication?    150 mg, Oral, 2x/day   Qty:  14 Tab   Refills:  0             DISCHARGE INSTRUCTIONS:     DISCHARGE INSTRUCTION - DIET   Diet: RESUME HOME DIET           DISCHARGE INSTRUCTION - ACTIVITY   Activity: GRADUALLY INCREASE ACTIVITY AS TOLERATED      DISCHARGE INSTRUCTION - MISC   Quit alcohol     ASPIRIN NOT ORDERED AT THIS TIME       Follow-up Information     Follow up with Ruffin FrederickFarah, Joy Ramsay, MD In 3 days.    Specialty:  EXTERNAL    Why:  You were discharged on a weekend, please call monday to make a followup apt.     Contact information:    [address]  [place] [id number]  [phone]          Follow up with Zara Chessrphanides, Timothy,  MD In 1 week.    Specialties:  GASTROENTEROLOGY, INTERNAL MEDICINE    Why:  You were discharged on a weekend.  Call monday to make a followup apt.     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 The patient had signed out against medical advise the same morning and she came back to ER that evening complaining of worsening pain. In the ED her lipase was 690.  She was treated conservatively for acute alcoholic pancreatitis with IV fluids.  No stones on MRI or ultrasound and there was no indication for cholecystectomy following pancreatitis.  HIDA scan was normal.  GI and surgery consulted.  The patient improved with conservative management.  Was discharged on Zantac for 8 weeks.  Advised to avoid alcohol NSAIDs.  Hepatitis-C antibody was positive.  Hep C RNA was pending and hepatitis B DNA and the hemochromatosis DNA is pending at the time of discharge. Patient was advised to follow up with GI and Infectious Disease for hepatitis-C and fatty liver.    SIGNIFICANT PHYSICAL FINDINGS: GENERAL: Patient is alert, awake and oriented X 3. In no cardio respiratory distress.   HEENT: PERRLA, EOMI.   NECK: Supple, NO JVD.   HEART: S1+, S2+, rate controlled and rhythm regular. No murmurs appreciated.   LUNGS:  Clear to auscultation bilaterally.   ABDOMEN: Soft, NT, ND with NABS.   EXTREMITIES: No edema, pedal pulses palpable.   NEURO: Cranial nerves 2 to 12 grossly intact, Motor exam is non focal.   SKIN: No rashes or bruises.   PSYCH: Pleasant with no signs of depression.      SIGNIFICANT LAB: emr  SIGNIFICANT RADIOLOGY: emr  CONSULTATIONS: gi   COURSE IN HOSPITAL: as above    DOES PATIENT HAVE ADVANCED DIRECTIVES:  No, Information Offered and Refused    ADVANCED CARE PLANNING - full code    CONDITION ON DISCHARGE: Alert, Oriented and VS Stable    DISCHARGE DISPOSITION:  Home discharge     Copies sent to Care Team       Relationship Specialty Notifications Start End    Ruffin Frederick, MD PCP - General EXTERNAL  07/24/16     Phone: [phone] Fax: [phone]         [address] [place] [address]          Time spent on coordinating discharge 40 minutes

## 2017-02-15 ENCOUNTER — Encounter: Payer: Self-pay | Admitting: *Deleted

## 2017-02-15 ENCOUNTER — Emergency Department
Admission: EM | Admit: 2017-02-15 | Discharge: 2017-02-15 | Disposition: A | Discharge status: Home / Self Care | Payer: Self-pay | Source: Home / Self Care | Attending: Emergency Medicine | Admitting: Emergency Medicine

## 2017-02-15 DIAGNOSIS — B001 Herpesviral vesicular dermatitis: Secondary | ICD-10-CM

## 2017-02-15 MED ORDER — ACYCLOVIR 5 % EX OINT: 1.0000 "application " | TOPICAL_OINTMENT | CUTANEOUS | 1 refills | Status: DC

## 2017-02-15 MED ORDER — VALACYCLOVIR HCL 1 G PO TABS: ORAL_TABLET | ORAL | 0 refills | Status: DC

## 2017-02-15 NOTE — ED Notes (Signed)
Password for results: "Butterfly"

## 2017-02-15 NOTE — ED Provider Notes (Signed)
[facility]    CSN: [id number] Arrival date & time: 02/15/17  1509     History   Chief Complaint Chief Complaint  Patient presents with  . Oral Swelling  . Rash    HPI Anne Holden is a 26 y.o. female.   HPI Patient c/o eating brownies with pecans 2 days ago she shortly developed lip swelling and now open sores. She has never eaten pecans before. She has eaten other kinds of nuts in the past without problems. After further history taking, she states that 2 days ago she got together with her boyfriend after a long separation. She states that he has had sex with other women, and she is suspicious that her lip sores could be caused by herpes.  She is not sure if her boyfriend has herpes. She denies itch or pain. Has tried a topical OTC without help. The open sores have started to dry and scab. Denies any intraoral symptoms or lesions. Denies itch. Denies dysphagia or other ENT symptoms. No chest pain or shortness of breath. Denies fever or chills or nausea or vomiting. She denies ever having herpes or cold sores in the past. Denies history of STD in the past. She denies any past history of HIV or other immunodeficiency. She denies any history of chronic disease.  She requests swab test for herpes and blood tests for HIV and syphilis blood testing today.  She denies any GYN symptoms. Denies any genital lesions or sores. Denies vaginal discharge or irregular periods. Denies dysuria or hematuria. She declined GYN exam here today. She denies chance of pregnancy.. Has IUD. Denies pelvic pain or abdominal pain or any vaginal discharge. Denies any history of pelvic infection in the past.   There are no active problems to display for this patient.   History reviewed. No pertinent surgical history.  OB History    No data available       Home Medications    Prior to Admission medications   Medication Sig Start Date End Date Taking? Authorizing Provider    acyclovir ointment (ZOVIRAX) 5 % Apply 1 application topically every 3 (three) hours. 02/15/17   Lajean Manes, MD  valACYclovir (VALTREX) 1000 MG tablet Take one by mouth twice a day for 10 days 02/15/17   Lajean Manes, MD    Family History History reviewed. No pertinent family history.  Social History Social History  Substance Use Topics  . Smoking status: Current Every Day Smoker    Packs/day: 0.50    Types: Cigarettes  . Smokeless tobacco: Never Used  . Alcohol use Not on file     Allergies   Patient has no known allergies.   Review of Systems Review of Systems  Constitutional: Negative for fever.  All other systems reviewed and are negative.  see above in history of present illness    Physical Exam Triage Vital Signs ED Triage Vitals  Enc Vitals Group     BP 02/15/17 1530 122/81     Pulse Rate 02/15/17 1530 (!) 104     Resp --      Temp 02/15/17 1530 98.1 F (36.7 C)     Temp Source 02/15/17 1530 Oral     SpO2 02/15/17 1530 100 %     Weight 02/15/17 1530 114 lb (51.7 kg)     Height --      Head Circumference --      Peak Flow --      Pain Score 02/15/17 1531 0  Pain Loc --      Pain Edu? --      Excl. in GC? --    No data found.   Updated Vital Signs BP 122/81 (BP Location: Left Arm)   Pulse (!) 104   Temp 98.1 F (36.7 C) (Oral)   Wt 114 lb (51.7 kg)   SpO2 100%   Visual Acuity Right Eye Distance:   Left Eye Distance:   Bilateral Distance:    Right Eye Near:   Left Eye Near:    Bilateral Near:     Physical Exam  Constitutional: She is oriented to person, place, and time. She appears well-developed and well-nourished. No distress.  HENT:  Head: Normocephalic and atraumatic.  Mouth/Throat: Oropharynx is clear and moist and mucous membranes are normal. No oropharyngeal exudate, posterior oropharyngeal edema or posterior oropharyngeal erythema.    Swollen lip lesions as depicted.   No facial swelling or other facial skin  abnormalities.  No intraoral lesions  Eyes: Pupils are equal, round, and reactive to light. No scleral icterus.  Neck: Normal range of motion. Neck supple.  Neck supple. No adenopathy  Cardiovascular: Normal rate and regular rhythm.   Pulmonary/Chest: Effort normal.  Abdominal: She exhibits no distension.  Neurological: She is alert and oriented to person, place, and time. No cranial nerve deficit.  Skin: Skin is warm and dry.  Psychiatric: She has a normal mood and affect. Her behavior is normal.  Vitals reviewed.    UC Treatments / Results  Labs (all labs ordered are listed, but only abnormal results are displayed) Labs Reviewed  HERPES SIMPLEX VIRUS CULTURE  RPR  HIV ANTIBODY (ROUTINE TESTING)    EKG  EKG Interpretation None       Radiology No results found.  Procedures Procedures (including critical care time)  Medications Ordered in UC Medications - No data to display   Initial Impression / Assessment and Plan / UC Course  I explained to her that they based on history and physical exam, these lip lesions are most likely herpes lesions, either herpes simplex 1 or 2 . I explained it's possible that these could be allergic reaction to pecans , but the absence of other swelling, or intraoral lesions, or any itch, makes this diagnosis much less likely.   I have reviewed the triage vital signs and the nursing notes.  Pertinent labs & imaging results that were available during my care of the patient were reviewed by me and considered in my medical decision making (see chart for details).    Testing and Rx options discussed. Risks, benefits, alternatives discussed. With her permission and consent, I carefully, firmly swabbed each of the 3 lesions on her lips and sent off for herpes culture. She declined blood test for herpes because of significant chance of false positive tests (and I agree with not doing this herpes blood test). Draw for HIV and RPR.  Start  acyclovir ointment and Valtrex by mouth twice a day.  Other advice given. An After Visit Summary was printed and given to the patient. Follow-up with your primary care doctor in 5-7 days if not improving, or sooner if symptoms become worse. Precautions discussed. Red flags discussed. Questions invited and answered. Patient voiced understanding and agreement.     Final diagnoses:  Cold sore    New Prescriptions New Prescriptions   ACYCLOVIR OINTMENT (ZOVIRAX) 5 %    Apply 1 application topically every 3 (three) hours.   VALACYCLOVIR (VALTREX) 1000 MG TABLET  Take one by mouth twice a day for 10 days     Lajean Manes, MD 02/15/17 [phone]

## 2017-02-15 NOTE — ED Triage Notes (Signed)
Patient c/o eating brownies with pecans 2 days ago she shortly developed lip swelling and now open sores. She has never eaten pecans before.

## 2017-02-15 NOTE — Discharge Instructions (Signed)
Start taking the Valtrex tablet twice a day by mouth. Start using the acyclovir ointment on lips every 3-4  hours for the next 5 days. Both of the above treatments would be used if this is a cold sore (herpes type I)  OR  if this is herpes type 2.  The swab is being sent off for herpes culture.

## 2017-02-16 LAB — HIV ANTIBODY (ROUTINE TESTING W REFLEX): HIV 1&2 Ab, 4th Generation: NONREACTIVE

## 2017-02-17 LAB — HERPES SIMPLEX VIRUS CULTURE: Organism ID, Bacteria: NOT DETECTED

## 2017-02-17 LAB — RPR

## 2017-02-18 ENCOUNTER — Telehealth: Payer: Self-pay | Admitting: *Deleted

## 2017-02-18 NOTE — Telephone Encounter (Signed)
Callback: No answer, VM box full. Unable to leave message. Will call back later.

## 2017-02-19 ENCOUNTER — Telehealth: Payer: Self-pay | Admitting: Emergency Medicine

## 2017-02-21 ENCOUNTER — Telehealth: Payer: Self-pay | Admitting: *Deleted

## 2017-02-21 NOTE — Telephone Encounter (Signed)
Spoke to pt password verified and lab results given.  

## 2017-07-23 ENCOUNTER — Inpatient Hospital Stay
Admission: EM | Admit: 2017-07-23 | Discharge: 2017-07-28 | DRG: 183 | Disposition: A | Discharge status: Home / Self Care | Payer: Self-pay | Attending: Internal Medicine | Admitting: Internal Medicine

## 2017-07-23 ENCOUNTER — Emergency Department: Payer: Self-pay

## 2017-07-23 ENCOUNTER — Encounter: Payer: Self-pay | Admitting: Emergency Medicine

## 2017-07-23 DIAGNOSIS — S0922XA Traumatic rupture of left ear drum, initial encounter: Secondary | ICD-10-CM | POA: Diagnosis present

## 2017-07-23 DIAGNOSIS — S0101XA Laceration without foreign body of scalp, initial encounter: Secondary | ICD-10-CM | POA: Diagnosis present

## 2017-07-23 DIAGNOSIS — S128XXA Fracture of other parts of neck, initial encounter: Secondary | ICD-10-CM | POA: Diagnosis present

## 2017-07-23 DIAGNOSIS — M542 Cervicalgia: Secondary | ICD-10-CM | POA: Diagnosis present

## 2017-07-23 DIAGNOSIS — Z0471 Encounter for examination and observation following alleged adult physical abuse: Secondary | ICD-10-CM | POA: Diagnosis present

## 2017-07-23 DIAGNOSIS — S2231XA Fracture of one rib, right side, initial encounter for closed fracture: Secondary | ICD-10-CM

## 2017-07-23 DIAGNOSIS — E860 Dehydration: Secondary | ICD-10-CM | POA: Diagnosis present

## 2017-07-23 DIAGNOSIS — T7491XA Unspecified adult maltreatment, confirmed, initial encounter: Secondary | ICD-10-CM

## 2017-07-23 DIAGNOSIS — T719XXA Asphyxiation due to unspecified cause, initial encounter: Secondary | ICD-10-CM | POA: Diagnosis present

## 2017-07-23 DIAGNOSIS — F4329 Adjustment disorder with other symptoms: Secondary | ICD-10-CM

## 2017-07-23 DIAGNOSIS — F4322 Adjustment disorder with anxiety: Secondary | ICD-10-CM | POA: Diagnosis present

## 2017-07-23 DIAGNOSIS — S2239XA Fracture of one rib, unspecified side, initial encounter for closed fracture: Secondary | ICD-10-CM | POA: Diagnosis present

## 2017-07-23 DIAGNOSIS — R52 Pain, unspecified: Secondary | ICD-10-CM | POA: Diagnosis present

## 2017-07-23 DIAGNOSIS — F17200 Nicotine dependence, unspecified, uncomplicated: Secondary | ICD-10-CM | POA: Diagnosis present

## 2017-07-23 DIAGNOSIS — S2241XA Multiple fractures of ribs, right side, initial encounter for closed fracture: Principal | ICD-10-CM | POA: Diagnosis present

## 2017-07-23 DIAGNOSIS — F430 Acute stress reaction: Secondary | ICD-10-CM | POA: Diagnosis present

## 2017-07-23 DIAGNOSIS — M6282 Rhabdomyolysis: Secondary | ICD-10-CM | POA: Diagnosis present

## 2017-07-23 DIAGNOSIS — S1014XA External constriction of part of throat, initial encounter: Secondary | ICD-10-CM | POA: Diagnosis present

## 2017-07-23 DIAGNOSIS — S01311A Laceration without foreign body of right ear, initial encounter: Secondary | ICD-10-CM | POA: Diagnosis present

## 2017-07-23 DIAGNOSIS — R490 Dysphonia: Secondary | ICD-10-CM | POA: Diagnosis present

## 2017-07-23 DIAGNOSIS — T148XXA Other injury of unspecified body region, initial encounter: Secondary | ICD-10-CM | POA: Diagnosis present

## 2017-07-23 DIAGNOSIS — R609 Edema, unspecified: Secondary | ICD-10-CM

## 2017-07-23 DIAGNOSIS — Z23 Encounter for immunization: Secondary | ICD-10-CM

## 2017-07-23 DIAGNOSIS — S2249XA Multiple fractures of ribs, unspecified side, initial encounter for closed fracture: Secondary | ICD-10-CM | POA: Diagnosis present

## 2017-07-23 HISTORY — DX: Acute pancreatitis without necrosis or infection, unspecified: K85.90

## 2017-07-23 HISTORY — DX: Opioid abuse, uncomplicated: F11.10

## 2017-07-23 HISTORY — DX: Unspecified viral hepatitis C without hepatic coma: B19.20

## 2017-07-23 LAB — BASIC METABOLIC PANEL
ANION GAP: 8 (ref 5–15)
BUN: 23 mg/dL — ABNORMAL HIGH (ref 6–20)
CHLORIDE: 107 mmol/L (ref 101–111)
CO2: 24 mmol/L (ref 22–32)
Calcium: 8.4 mg/dL — ABNORMAL LOW (ref 8.9–10.3)
Creatinine, Ser: 1.14 mg/dL — ABNORMAL HIGH (ref 0.44–1.00)
GFR calc Af Amer: >60 mL/min (ref >60)
GFR calc non Af Amer: >60 mL/min (ref >60)
Glucose, Bld: 90 mg/dL (ref 65–99)
POTASSIUM: 3.6 mmol/L (ref 3.5–5.1)
SODIUM: 139 mmol/L (ref 135–145)

## 2017-07-23 LAB — HCG, QUANTITATIVE, PREGNANCY: hCG, Beta Chain, Quant, S: 1 m[IU]/mL (ref <5)

## 2017-07-23 LAB — HEPATIC FUNCTION PANEL
ALBUMIN: 3.7 g/dL (ref 3.5–5.0)
ALK PHOS: 66 U/L (ref 38–126)
ALT: 13 U/L — ABNORMAL LOW (ref 14–54)
AST: 47 U/L — ABNORMAL HIGH (ref 15–41)
BILIRUBIN TOTAL: 2.2 mg/dL — AB (ref 0.3–1.2)
Bilirubin, Direct: 0.3 mg/dL (ref 0.1–0.5)
Indirect Bilirubin: 1.9 mg/dL — ABNORMAL HIGH (ref 0.3–0.9)
TOTAL PROTEIN: 6.1 g/dL — AB (ref 6.5–8.1)

## 2017-07-23 LAB — CBC WITH DIFFERENTIAL/PLATELET
Basophils Absolute: 0 10*3/uL (ref 0–0.1)
Basophils Relative: 0 %
EOS ABS: 0 10*3/uL (ref 0–0.7)
EOS PCT: 0 %
HCT: 30.5 % — ABNORMAL LOW (ref 35.0–47.0)
Hemoglobin: 10.3 g/dL — ABNORMAL LOW (ref 12.0–16.0)
LYMPHS ABS: 1 10*3/uL (ref 1.0–3.6)
Lymphocytes Relative: 8 %
MCH: 29.1 pg (ref 26.0–34.0)
MCHC: 33.7 g/dL (ref 32.0–36.0)
MCV: 86.2 fL (ref 80.0–100.0)
MONOS PCT: 9 %
Monocytes Absolute: 1 10*3/uL — ABNORMAL HIGH (ref 0.2–0.9)
Neutro Abs: 9.8 10*3/uL — ABNORMAL HIGH (ref 1.4–6.5)
Neutrophils Relative %: 83 %
Platelets: 134 10*3/uL — ABNORMAL LOW (ref 150–440)
RBC: 3.54 MIL/uL — AB (ref 3.80–5.20)
RDW: 13.3 % (ref 11.5–14.5)
WBC: 11.8 10*3/uL — AB (ref 3.6–11.0)

## 2017-07-23 LAB — CK: CK TOTAL: 658 U/L — AB (ref 38–234)

## 2017-07-23 MED ORDER — HYDROMORPHONE HCL 1 MG/ML IJ SOLN
1.0000 mg | INTRAMUSCULAR | Status: DC | PRN
  Administered 2017-07-24 – 2017-07-27 (×20): 1 mg via INTRAVENOUS
  Filled 2017-07-23 (×20): qty 1

## 2017-07-23 MED ORDER — FENTANYL CITRATE (PF) 100 MCG/2ML IJ SOLN
100.0000 ug | Freq: Once | INTRAMUSCULAR | Status: AC
  Administered 2017-07-23: 100 ug via INTRAVENOUS
  Filled 2017-07-23: qty 2

## 2017-07-23 MED ORDER — OXYCODONE HCL 5 MG PO TABS
5.0000 mg | ORAL_TABLET | ORAL | Status: DC | PRN
  Administered 2017-07-24 – 2017-07-25 (×3): 5 mg via ORAL
  Filled 2017-07-23 (×3): qty 1

## 2017-07-23 MED ORDER — TETANUS-DIPHTH-ACELL PERTUSSIS 5-2.5-18.5 LF-MCG/0.5 IM SUSP
0.5000 mL | Freq: Once | INTRAMUSCULAR | Status: AC
  Administered 2017-07-23: 0.5 mL via INTRAMUSCULAR
  Filled 2017-07-23: qty 0.5

## 2017-07-23 MED ORDER — ENOXAPARIN SODIUM 40 MG/0.4ML ~~LOC~~ SOLN
40.0000 mg | SUBCUTANEOUS | Status: DC
  Administered 2017-07-24 – 2017-07-27 (×4): 40 mg via SUBCUTANEOUS
  Filled 2017-07-23 (×4): qty 0.4

## 2017-07-23 MED ORDER — ACETAMINOPHEN 650 MG RE SUPP: 650.0000 mg | Freq: Four times a day (QID) | RECTAL | Status: DC | PRN

## 2017-07-23 MED ORDER — ONDANSETRON HCL 4 MG PO TABS: 4.0000 mg | ORAL_TABLET | Freq: Four times a day (QID) | ORAL | Status: DC | PRN

## 2017-07-23 MED ORDER — ACETAMINOPHEN 325 MG PO TABS
650.0000 mg | ORAL_TABLET | Freq: Four times a day (QID) | ORAL | Status: DC | PRN
  Administered 2017-07-25 – 2017-07-26 (×2): 650 mg via ORAL
  Filled 2017-07-23 (×3): qty 2

## 2017-07-23 MED ORDER — KETOROLAC TROMETHAMINE 15 MG/ML IJ SOLN
15.0000 mg | Freq: Four times a day (QID) | INTRAMUSCULAR | Status: DC | PRN
  Administered 2017-07-24 – 2017-07-25 (×3): 15 mg via INTRAVENOUS
  Filled 2017-07-23 (×3): qty 1

## 2017-07-23 MED ORDER — LIDOCAINE-EPINEPHRINE 1 %-1:100000 IJ SOLN
30.0000 mL | Freq: Once | INTRAMUSCULAR | Status: AC
  Administered 2017-07-23: 30 mL via INTRADERMAL
  Filled 2017-07-23: qty 30

## 2017-07-23 MED ORDER — IOPAMIDOL (ISOVUE-300) INJECTION 61%
75.0000 mL | Freq: Once | INTRAVENOUS | Status: AC | PRN
  Administered 2017-07-23: 75 mL via INTRAVENOUS

## 2017-07-23 MED ORDER — ONDANSETRON HCL 4 MG/2ML IJ SOLN: 4.0000 mg | Freq: Four times a day (QID) | INTRAMUSCULAR | Status: DC | PRN

## 2017-07-23 MED ORDER — SODIUM CHLORIDE 0.9 % IV BOLUS (SEPSIS)
1000.0000 mL | Freq: Once | INTRAVENOUS | Status: AC
  Administered 2017-07-23: 1000 mL via INTRAVENOUS

## 2017-07-23 NOTE — ED Notes (Signed)
[name], pt advocate with pt att
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
## 2017-07-23 NOTE — ED Notes (Signed)
Per EDP, specialist will complete suturing.
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
## 2017-07-23 NOTE — ED Notes (Signed)
Patient transported to CT 
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
## 2017-07-23 NOTE — ED Notes (Signed)
Pt requiring suturing by EDP, delay transport until completed by EDP
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
## 2017-07-23 NOTE — ED Notes (Signed)
Hospitalist and pt relations at bedside

## 2017-07-24 LAB — BASIC METABOLIC PANEL
ANION GAP: 7 (ref 5–15)
BUN: 22 mg/dL — ABNORMAL HIGH (ref 6–20)
CHLORIDE: 107 mmol/L (ref 101–111)
CO2: 23 mmol/L (ref 22–32)
Calcium: 8.6 mg/dL — ABNORMAL LOW (ref 8.9–10.3)
Creatinine, Ser: 0.79 mg/dL (ref 0.44–1.00)
GFR calc non Af Amer: >60 mL/min (ref >60)
Glucose, Bld: 108 mg/dL — ABNORMAL HIGH (ref 65–99)
Potassium: 3.9 mmol/L (ref 3.5–5.1)
Sodium: 137 mmol/L (ref 135–145)

## 2017-07-24 LAB — URINALYSIS, COMPLETE (UACMP) WITH MICROSCOPIC
BILIRUBIN URINE: NEGATIVE
GLUCOSE, UA: NEGATIVE mg/dL
KETONES UR: 5 mg/dL — AB
LEUKOCYTES UA: NEGATIVE
NITRITE: NEGATIVE
PH: 6 (ref 5.0–8.0)
Protein, ur: 30 mg/dL — AB
Specific Gravity, Urine: >1.046 — ABNORMAL HIGH (ref 1.005–1.030)

## 2017-07-24 LAB — CBC
HCT: 30 % — ABNORMAL LOW (ref 35.0–47.0)
Hemoglobin: 10.5 g/dL — ABNORMAL LOW (ref 12.0–16.0)
MCH: 30.4 pg (ref 26.0–34.0)
MCHC: 35.1 g/dL (ref 32.0–36.0)
MCV: 86.5 fL (ref 80.0–100.0)
PLATELETS: 118 10*3/uL — AB (ref 150–440)
RBC: 3.47 MIL/uL — AB (ref 3.80–5.20)
RDW: 13 % (ref 11.5–14.5)
WBC: 8.8 10*3/uL (ref 3.6–11.0)

## 2017-07-24 MED ORDER — INFLUENZA VAC SPLIT QUAD 0.5 ML IM SUSY
0.5000 mL | PREFILLED_SYRINGE | INTRAMUSCULAR | Status: AC
  Administered 2017-07-28: 0.5 mL via INTRAMUSCULAR
  Filled 2017-07-24: qty 0.5

## 2017-07-24 MED ORDER — OXYCODONE HCL ER 10 MG PO T12A
10.0000 mg | EXTENDED_RELEASE_TABLET | Freq: Two times a day (BID) | ORAL | Status: DC
  Administered 2017-07-24 – 2017-07-27 (×6): 10 mg via ORAL
  Filled 2017-07-24 (×7): qty 1

## 2017-07-24 MED ORDER — CIPROFLOXACIN-DEXAMETHASONE 0.3-0.1 % OT SUSP
4.0000 [drp] | Freq: Two times a day (BID) | OTIC | Status: DC
  Administered 2017-07-24 – 2017-07-28 (×9): 4 [drp] via OTIC
  Filled 2017-07-24: qty 7.5

## 2017-07-24 MED ORDER — SODIUM CHLORIDE 0.9 % IV SOLN
INTRAVENOUS | Status: AC
  Administered 2017-07-24 – 2017-07-25 (×2): via INTRAVENOUS

## 2017-07-24 MED ORDER — GENTAMICIN SULFATE 0.1 % EX OINT
TOPICAL_OINTMENT | Freq: Three times a day (TID) | CUTANEOUS | Status: DC
  Administered 2017-07-24 – 2017-07-28 (×13): via TOPICAL
  Filled 2017-07-24: qty 15
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
## 2017-07-24 NOTE — Op Note (Signed)
....  07/24/2017  12:33 PM    Anne Holden  [id number]   Pre-Op Dx:  Hyoid and Thyroid cartilage fracture, dysphonia, odynophagia  Post-op Dx: same  Proc: Trans-nasal flexible laryngosocpy  Surg:  Kerry Dory:  Topical  EBL:  none  Comp:  none  Findings:  Normal examination with no significant laryngeal or vocal fold edema.  No bruising.  Wide open airway.  Normal true vocal fold motion.  Procedure: After the patient was identified in her hospital bed, verbal consent was obtained.  1ml of 4% Lidocaine with Phenylephrine was sprayed into her left nasal cavity.  At this time, the flexible laryngoscope was brought onto the field and inserted into the patient's left nasal cavity.  This was advanced for visualization of the nasal cavity and nasopharynx.  Mild rightward deviation was found of the septum and previous adenoidectomy.  Evaluation of the pharynx, larynx and hypopharynx was normal.  Normal vocal fold mobility and normal color of vocal folds.  No bruising was noted and not asymmetry was found with motion.  The patient tolerated the procedure well.  Dispo:  Will follow up tomorrow.  Voice rest as much as possible.  No mucosal laceration seen.     Anne Holden  07/24/2017 12:33 PM
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
## 2017-07-25 LAB — CK: CK TOTAL: 596 U/L — AB (ref 38–234)

## 2017-07-25 MED ORDER — NICOTINE 21 MG/24HR TD PT24
21.0000 mg | MEDICATED_PATCH | Freq: Every day | TRANSDERMAL | Status: DC
  Administered 2017-07-25 – 2017-07-28 (×4): 21 mg via TRANSDERMAL
  Filled 2017-07-25 (×4): qty 1

## 2017-07-25 MED ORDER — KETOROLAC TROMETHAMINE 15 MG/ML IJ SOLN
15.0000 mg | INTRAMUSCULAR | Status: DC | PRN
  Administered 2017-07-25 – 2017-07-26 (×4): 15 mg via INTRAVENOUS
  Filled 2017-07-25 (×4): qty 1

## 2017-07-25 MED ORDER — ALPRAZOLAM 0.25 MG PO TABS
0.2500 mg | ORAL_TABLET | Freq: Three times a day (TID) | ORAL | Status: DC | PRN
  Administered 2017-07-25 – 2017-07-26 (×3): 0.25 mg via ORAL
  Filled 2017-07-25 (×3): qty 1

## 2017-07-25 MED ORDER — VALACYCLOVIR HCL 500 MG PO TABS
1000.0000 mg | ORAL_TABLET | Freq: Every day | ORAL | Status: DC
  Administered 2017-07-25 – 2017-07-28 (×4): 1000 mg via ORAL
  Filled 2017-07-25 (×4): qty 2
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
## 2017-07-25 NOTE — Plan of Care (Signed)
Problem: Health Behavior/Discharge Planning: Goal: Identification of resources available to assist in meeting health care needs will improve Outcome: Progressing Refusing pastoral care referral.  Communicating effectively and receiving support from social work and nursing.

## 2017-07-25 NOTE — Clinical Social Work Note (Signed)
CSW attempted to speak with patient and was able to get the name and number of her contact at [facility]: Lilia Pro: [phone] when the physician came in to do his rounds. CSW was informed by patient's nurse that after the MD left, patient is requesting if CSW could return later so she could rest. York Spaniel MSW,LCSW [phone]

## 2017-07-25 NOTE — Plan of Care (Signed)
Problem: Activity: Goal: Risk for activity intolerance will decrease Outcome: Progressing Ambulating to bathroom independently.  Steady gait.
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
## 2017-07-26 DIAGNOSIS — F430 Acute stress reaction: Secondary | ICD-10-CM

## 2017-07-26 DIAGNOSIS — F4329 Adjustment disorder with other symptoms: Secondary | ICD-10-CM

## 2017-07-26 LAB — CBC
HEMATOCRIT: 27.4 % — AB (ref 35.0–47.0)
Hemoglobin: 9.6 g/dL — ABNORMAL LOW (ref 12.0–16.0)
MCH: 30.8 pg (ref 26.0–34.0)
MCHC: 35.2 g/dL (ref 32.0–36.0)
MCV: 87.5 fL (ref 80.0–100.0)
PLATELETS: 95 10*3/uL — AB (ref 150–440)
RBC: 3.13 MIL/uL — AB (ref 3.80–5.20)
RDW: 13.3 % (ref 11.5–14.5)
WBC: 4.7 10*3/uL (ref 3.6–11.0)

## 2017-07-26 LAB — BASIC METABOLIC PANEL
Anion gap: 7 (ref 5–15)
BUN: 15 mg/dL (ref 6–20)
CALCIUM: 8.3 mg/dL — AB (ref 8.9–10.3)
CO2: 24 mmol/L (ref 22–32)
Chloride: 107 mmol/L (ref 101–111)
Creatinine, Ser: 0.63 mg/dL (ref 0.44–1.00)
GFR calc Af Amer: >60 mL/min (ref >60)
Glucose, Bld: 100 mg/dL — ABNORMAL HIGH (ref 65–99)
POTASSIUM: 4.6 mmol/L (ref 3.5–5.1)
SODIUM: 138 mmol/L (ref 135–145)

## 2017-07-26 MED ORDER — CLONAZEPAM 0.5 MG PO TABS
0.2500 mg | ORAL_TABLET | Freq: Three times a day (TID) | ORAL | Status: DC | PRN
  Administered 2017-07-26 – 2017-07-28 (×4): 0.25 mg via ORAL
  Filled 2017-07-26 (×4): qty 1

## 2017-07-26 MED ORDER — IBUPROFEN 200 MG PO TABS
200.0000 mg | ORAL_TABLET | Freq: Three times a day (TID) | ORAL | Status: DC
  Administered 2017-07-26 – 2017-07-28 (×6): 200 mg via ORAL
  Filled 2017-07-26 (×11): qty 1

## 2017-07-26 MED ORDER — PANTOPRAZOLE SODIUM 40 MG PO TBEC
40.0000 mg | DELAYED_RELEASE_TABLET | Freq: Every day | ORAL | Status: DC
Start: 2017-07-26 — End: 2017-07-28
  Administered 2017-07-26 – 2017-07-28 (×3): 40 mg via ORAL
  Filled 2017-07-26 (×3): qty 1

## 2017-07-26 NOTE — Clinical Social Work Note (Signed)
CSW informed by MD that discharge is anticipated for tomorrow. CSW has notified Caitie at [facility] and she has stated she is expecting to have a bed for patient tomorrow. York Spaniel MSW,LCSW [phone]
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
## 2017-07-27 ENCOUNTER — Inpatient Hospital Stay: Payer: Self-pay

## 2017-07-27 LAB — CK: Total CK: 206 U/L (ref 38–234)

## 2017-07-27 MED ORDER — MELATONIN 5 MG PO TABS
5.0000 mg | ORAL_TABLET | Freq: Every day | ORAL | Status: DC
  Administered 2017-07-27: 5 mg via ORAL
  Filled 2017-07-27 (×2): qty 1

## 2017-07-27 MED ORDER — OXYCODONE HCL 5 MG PO TABS
5.0000 mg | ORAL_TABLET | ORAL | Status: DC | PRN
  Administered 2017-07-27 – 2017-07-28 (×7): 5 mg via ORAL
  Filled 2017-07-27 (×7): qty 1

## 2017-07-27 MED ORDER — OXYCODONE HCL ER 10 MG PO T12A
10.0000 mg | EXTENDED_RELEASE_TABLET | Freq: Once | ORAL | Status: AC
  Administered 2017-07-27: 10 mg via ORAL
  Filled 2017-07-27: qty 1

## 2017-07-27 MED ORDER — HYDROMORPHONE HCL 1 MG/ML IJ SOLN
1.0000 mg | INTRAMUSCULAR | Status: DC | PRN
  Administered 2017-07-27 – 2017-07-28 (×7): 1 mg via INTRAVENOUS
  Filled 2017-07-27 (×7): qty 1

## 2017-07-27 MED ORDER — OXYCODONE HCL ER 15 MG PO T12A
15.0000 mg | EXTENDED_RELEASE_TABLET | Freq: Two times a day (BID) | ORAL | Status: DC
  Administered 2017-07-27: 15 mg via ORAL
  Filled 2017-07-27 (×2): qty 1
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
## 2017-07-27 NOTE — Clinical Social Work Note (Addendum)
Patient will not be discharging today as expected due to pain control and swelling of ankle. CS[facility] agency[facility]arding [facility] not discharging today. York Spaniel MSW,LCSW [phone]

## 2017-07-28 ENCOUNTER — Inpatient Hospital Stay: Payer: Self-pay

## 2017-07-28 MED ORDER — HYDROMORPHONE HCL 2 MG PO TABS: 1.0000 mg | ORAL_TABLET | ORAL | 0 refills | Status: AC | PRN

## 2017-07-28 MED ORDER — IBUPROFEN 200 MG PO TABS: 200.0000 mg | ORAL_TABLET | Freq: Three times a day (TID) | ORAL | 0 refills | Status: DC

## 2017-07-28 MED ORDER — ADULT MULTIVITAMIN W/MINERALS CH
1.0000 | ORAL_TABLET | Freq: Every day | ORAL | Status: DC
  Administered 2017-07-28: 1 via ORAL
  Filled 2017-07-28: qty 1

## 2017-07-28 MED ORDER — OXYCODONE HCL 5 MG PO TABS: 5.0000 mg | ORAL_TABLET | ORAL | 0 refills | Status: DC | PRN

## 2017-07-28 MED ORDER — CLONAZEPAM 0.5 MG PO TABS: 0.2500 mg | ORAL_TABLET | Freq: Three times a day (TID) | ORAL | 0 refills | Status: AC | PRN

## 2017-07-28 MED ORDER — NICOTINE 21 MG/24HR TD PT24: 21.0000 mg | MEDICATED_PATCH | Freq: Every day | TRANSDERMAL | 0 refills | Status: AC

## 2017-07-28 MED ORDER — OXYCODONE HCL ER 10 MG PO T12A
20.0000 mg | EXTENDED_RELEASE_TABLET | Freq: Two times a day (BID) | ORAL | Status: DC
  Administered 2017-07-28: 20 mg via ORAL
  Filled 2017-07-28: qty 2

## 2017-07-28 MED ORDER — OXYCODONE HCL ER 20 MG PO T12A: 20.0000 mg | EXTENDED_RELEASE_TABLET | Freq: Two times a day (BID) | ORAL | 0 refills | Status: AC

## 2017-07-28 MED ORDER — ENSURE ENLIVE PO LIQD
237.0000 mL | Freq: Two times a day (BID) | ORAL | Status: DC
  Administered 2017-07-28 (×2): 237 mL via ORAL

## 2017-07-28 NOTE — Care Management (Signed)
Patient to discharge today.  Patient is uninsured.  Patient provided application for Medication Management  And ODC.  Patient to discharge on controlled substances.  Patient provided coupons from [email or web address].  Patient expresses concerns about obtaining her medications, due to not having an ID and no money.  Patient to discharge to a shelter today.  Patient states "the lady at the shelter has said they would be my advocate in getting my medication."  RNCM signing off.
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
## 2017-07-28 NOTE — Clinical Social Work Note (Signed)
Patient requested to see CSW and RN CM immediately this morning. CSW was able to go in first and patient informed CSW that she has no insurance and no ability to pay for her medications. CSW explained that the RN CM would look at the discharge medications and assist where possible but that we would not be able to assist with any controlled substances. Patient immediately became upset and her demeanor changed. She became angry that she would not receive free narcotics and stated, "So what am I suppose to do, take tylenol?" CSW explained that the physician could speak with her further about pain control options.   CSW contacted Renee at the [facility] and informed her that the physician was going to discharge patient today if her xray came back negative for any fractures or injuries. Renee stated that we could call her and they would make arrangements for patient to come to shelter. York Spaniel MSW,LCSW [phone]
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
## 2017-08-01 ENCOUNTER — Emergency Department: Payer: Self-pay

## 2017-08-01 ENCOUNTER — Emergency Department
Admission: EM | Admit: 2017-08-01 | Discharge: 2017-08-02 | Disposition: A | Discharge status: Home / Self Care | Payer: Self-pay | Attending: Emergency Medicine | Admitting: Emergency Medicine

## 2017-08-01 ENCOUNTER — Encounter: Payer: Self-pay | Admitting: Emergency Medicine

## 2017-08-01 DIAGNOSIS — F17210 Nicotine dependence, cigarettes, uncomplicated: Secondary | ICD-10-CM | POA: Insufficient documentation

## 2017-08-01 DIAGNOSIS — M79604 Pain in right leg: Secondary | ICD-10-CM | POA: Insufficient documentation

## 2017-08-01 MED ORDER — ONDANSETRON 4 MG PO TBDP
4.0000 mg | ORAL_TABLET | Freq: Once | ORAL | Status: AC
  Administered 2017-08-01: 4 mg via ORAL
  Filled 2017-08-01: qty 1

## 2017-08-01 MED ORDER — KETOROLAC TROMETHAMINE 30 MG/ML IJ SOLN
60.0000 mg | Freq: Once | INTRAMUSCULAR | Status: AC
  Administered 2017-08-02: 60 mg via INTRAMUSCULAR
  Filled 2017-08-01: qty 2

## 2017-08-01 MED ORDER — HYDROMORPHONE HCL 1 MG/ML IJ SOLN
2.0000 mg | Freq: Once | INTRAMUSCULAR | Status: AC
  Administered 2017-08-02: 2 mg via INTRAMUSCULAR
  Filled 2017-08-01: qty 2

## 2017-08-01 MED ORDER — OXYCODONE-ACETAMINOPHEN 5-325 MG PO TABS: 1.0000 | ORAL_TABLET | ORAL | Status: DC | PRN

## 2017-08-01 MED ORDER — FENTANYL CITRATE (PF) 100 MCG/2ML IJ SOLN
50.0000 ug | INTRAMUSCULAR | Status: AC | PRN
  Administered 2017-08-01 (×2): 50 ug via NASAL
  Filled 2017-08-01: qty 2
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
## 2017-08-02 ENCOUNTER — Emergency Department: Payer: Self-pay

## 2017-08-02 MED ORDER — HYDROMORPHONE HCL 1 MG/ML IJ SOLN
INTRAMUSCULAR | Status: AC
  Filled 2017-08-02: qty 1

## 2017-08-02 MED ORDER — HYDROMORPHONE HCL 2 MG PO TABS
2.0000 mg | ORAL_TABLET | Freq: Once | ORAL | Status: AC
  Administered 2017-08-02: 2 mg via ORAL
  Filled 2017-08-02: qty 1

## 2017-08-02 MED ORDER — IBUPROFEN 600 MG PO TABS: 600.0000 mg | ORAL_TABLET | Freq: Three times a day (TID) | ORAL | 0 refills | Status: AC | PRN

## 2017-08-02 MED ORDER — KETOROLAC TROMETHAMINE 30 MG/ML IJ SOLN
INTRAMUSCULAR | Status: AC
  Filled 2017-08-02: qty 1

## 2017-08-02 NOTE — ED Notes (Signed)

## 2017-08-02 NOTE — ED Notes (Signed)
Pt returned from ultrasound

## 2017-08-02 NOTE — ED Notes (Signed)
Pt provided with crutches and instructions for use per MD order; pt return demonstrated use of crutches.

## 2017-08-02 NOTE — ED Notes (Signed)
Patient crying upon entering the room states " I'm just in a lot of pain."

## 2017-08-02 NOTE — ED Notes (Signed)
Pt went to ultrasound.

## 2017-08-02 NOTE — Discharge Instructions (Signed)
Use walker as needed for balance. Elevate leg and apply ice pack to reduce swelling. Return to the ER for worsening symptoms, persistent vomiting, difficulty breathing or other concerns.

## 2017-08-03 ENCOUNTER — Emergency Department: Payer: Self-pay

## 2017-08-03 ENCOUNTER — Emergency Department
Admission: EM | Admit: 2017-08-03 | Discharge: 2017-08-03 | Disposition: A | Discharge status: Home / Self Care | Payer: Self-pay | Attending: Emergency Medicine | Admitting: Emergency Medicine

## 2017-08-03 DIAGNOSIS — M79671 Pain in right foot: Secondary | ICD-10-CM | POA: Insufficient documentation

## 2017-08-03 DIAGNOSIS — F17210 Nicotine dependence, cigarettes, uncomplicated: Secondary | ICD-10-CM | POA: Insufficient documentation

## 2017-08-03 DIAGNOSIS — Z79899 Other long term (current) drug therapy: Secondary | ICD-10-CM | POA: Insufficient documentation

## 2017-08-03 MED ORDER — MELOXICAM 15 MG PO TABS: 15.0000 mg | ORAL_TABLET | Freq: Every day | ORAL | 0 refills | Status: AC

## 2017-08-03 MED ORDER — HYDROCODONE-ACETAMINOPHEN 5-325 MG PO TABS
1.0000 | ORAL_TABLET | Freq: Once | ORAL | Status: AC
  Administered 2017-08-03: 1 via ORAL
  Filled 2017-08-03: qty 1
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
## 2017-08-03 NOTE — ED Notes (Signed)
Requesting pain meds. Provider aware
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
## 2017-11-09 ENCOUNTER — Encounter: Payer: Self-pay | Admitting: Emergency Medicine

## 2017-11-09 ENCOUNTER — Other Ambulatory Visit: Payer: Self-pay

## 2017-11-09 ENCOUNTER — Emergency Department
Admission: EM | Admit: 2017-11-09 | Discharge: 2017-11-09 | Disposition: A | Discharge status: Home / Self Care | Payer: Medicaid Other | Attending: Emergency Medicine | Admitting: Emergency Medicine

## 2017-11-09 DIAGNOSIS — Z79891 Long term (current) use of opiate analgesic: Secondary | ICD-10-CM | POA: Insufficient documentation

## 2017-11-09 DIAGNOSIS — B9689 Other specified bacterial agents as the cause of diseases classified elsewhere: Secondary | ICD-10-CM

## 2017-11-09 DIAGNOSIS — F17210 Nicotine dependence, cigarettes, uncomplicated: Secondary | ICD-10-CM | POA: Insufficient documentation

## 2017-11-09 DIAGNOSIS — Z113 Encounter for screening for infections with a predominantly sexual mode of transmission: Secondary | ICD-10-CM | POA: Diagnosis present

## 2017-11-09 DIAGNOSIS — N760 Acute vaginitis: Secondary | ICD-10-CM | POA: Insufficient documentation

## 2017-11-09 LAB — URINALYSIS, COMPLETE (UACMP) WITH MICROSCOPIC
BACTERIA UA: NONE SEEN
Bilirubin Urine: NEGATIVE
Glucose, UA: NEGATIVE mg/dL
Hgb urine dipstick: NEGATIVE
KETONES UR: NEGATIVE mg/dL
LEUKOCYTES UA: NEGATIVE
Nitrite: NEGATIVE
PH: 6 (ref 5.0–8.0)
PROTEIN: NEGATIVE mg/dL
Specific Gravity, Urine: 1.01 (ref 1.005–1.030)

## 2017-11-09 LAB — CHLAMYDIA/NGC RT PCR (ARMC ONLY)
Chlamydia Tr: NOT DETECTED
N gonorrhoeae: NOT DETECTED

## 2017-11-09 LAB — WET PREP, GENITAL
Sperm: NONE SEEN
TRICH WET PREP: NONE SEEN
Yeast Wet Prep HPF POC: NONE SEEN

## 2017-11-09 LAB — POCT PREGNANCY, URINE: PREG TEST UR: NEGATIVE

## 2017-11-09 MED ORDER — METRONIDAZOLE 500 MG PO TABS: 500.0000 mg | ORAL_TABLET | Freq: Two times a day (BID) | ORAL | 0 refills | Status: AC

## 2017-11-09 NOTE — Discharge Instructions (Signed)
Follow up with the health department for birth control management and removal of your IUD.  Do not drink alcohol while taking the Flagyl. You will likely vomit if you do.

## 2017-11-09 NOTE — ED Triage Notes (Signed)
Presents with a 2 week hx of vaginal discharge and odor

## 2017-11-09 NOTE — ED Notes (Signed)
FN: possible STD
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
